# Patient Record
Sex: Female | Born: 1980 | Race: Black or African American | Hispanic: No | Marital: Married | Smoking: Never smoker
Health system: Southern US, Community
[De-identification: ages and names within clinical notes are randomized; demographics above are authoritative.]

## PROBLEM LIST (undated history)

## (undated) ENCOUNTER — Inpatient Hospital Stay (HOSPITAL_COMMUNITY): Payer: Self-pay

## (undated) DIAGNOSIS — K219 Gastro-esophageal reflux disease without esophagitis: Secondary | ICD-10-CM

## (undated) DIAGNOSIS — R609 Edema, unspecified: Secondary | ICD-10-CM

## (undated) DIAGNOSIS — Z6791 Unspecified blood type, Rh negative: Secondary | ICD-10-CM

## (undated) DIAGNOSIS — O24419 Gestational diabetes mellitus in pregnancy, unspecified control: Secondary | ICD-10-CM

## (undated) DIAGNOSIS — B999 Unspecified infectious disease: Secondary | ICD-10-CM

## (undated) DIAGNOSIS — K8020 Calculus of gallbladder without cholecystitis without obstruction: Secondary | ICD-10-CM

## (undated) DIAGNOSIS — D573 Sickle-cell trait: Secondary | ICD-10-CM

## (undated) DIAGNOSIS — Z3689 Encounter for other specified antenatal screening: Secondary | ICD-10-CM

## (undated) DIAGNOSIS — K047 Periapical abscess without sinus: Secondary | ICD-10-CM

## (undated) HISTORY — DX: Unspecified blood type, rh negative: Z67.91

## (undated) HISTORY — DX: Gestational diabetes mellitus in pregnancy, unspecified control: O24.419

## (undated) HISTORY — PX: WISDOM TOOTH EXTRACTION: SHX21

## (undated) HISTORY — PX: CHOLECYSTECTOMY: SHX55

## (undated) HISTORY — DX: Sickle-cell trait: D57.3

## (undated) HISTORY — DX: Edema, unspecified: R60.9

## (undated) HISTORY — DX: Unspecified infectious disease: B99.9

---
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
## 1998-10-31 ENCOUNTER — Inpatient Hospital Stay (HOSPITAL_COMMUNITY): Admission: AD | Admit: 1998-10-31 | Discharge: 1998-10-31 | Payer: Self-pay | Admitting: Obstetrics & Gynecology

## 1998-11-10 ENCOUNTER — Inpatient Hospital Stay (HOSPITAL_COMMUNITY): Admission: AD | Admit: 1998-11-10 | Discharge: 1998-11-10 | Payer: Self-pay | Admitting: *Deleted

## 1998-11-10 ENCOUNTER — Encounter: Payer: Self-pay | Admitting: Obstetrics & Gynecology

## 1999-02-01 ENCOUNTER — Ambulatory Visit (HOSPITAL_COMMUNITY): Admission: RE | Admit: 1999-02-01 | Discharge: 1999-02-01 | Payer: Self-pay | Admitting: *Deleted

## 1999-04-15 ENCOUNTER — Inpatient Hospital Stay (HOSPITAL_COMMUNITY): Admission: AD | Admit: 1999-04-15 | Discharge: 1999-04-15 | Payer: Self-pay | Admitting: *Deleted

## 1999-05-01 DIAGNOSIS — R609 Edema, unspecified: Secondary | ICD-10-CM

## 1999-05-01 HISTORY — DX: Edema, unspecified: R60.9

## 1999-05-19 ENCOUNTER — Inpatient Hospital Stay (HOSPITAL_COMMUNITY): Admission: AD | Admit: 1999-05-19 | Discharge: 1999-05-19 | Payer: Self-pay | Admitting: *Deleted

## 1999-06-22 ENCOUNTER — Inpatient Hospital Stay (HOSPITAL_COMMUNITY): Admission: AD | Admit: 1999-06-22 | Discharge: 1999-06-25 | Payer: Self-pay | Admitting: *Deleted

## 1999-08-30 ENCOUNTER — Encounter: Payer: Self-pay | Admitting: Emergency Medicine

## 1999-08-30 ENCOUNTER — Emergency Department (HOSPITAL_COMMUNITY): Admission: EM | Admit: 1999-08-30 | Discharge: 1999-08-30 | Payer: Self-pay | Admitting: Emergency Medicine

## 1999-11-11 ENCOUNTER — Emergency Department (HOSPITAL_COMMUNITY): Admission: EM | Admit: 1999-11-11 | Discharge: 1999-11-11 | Payer: Self-pay | Admitting: Emergency Medicine

## 2000-03-03 ENCOUNTER — Emergency Department (HOSPITAL_COMMUNITY): Admission: EM | Admit: 2000-03-03 | Discharge: 2000-03-03 | Payer: Self-pay | Admitting: Emergency Medicine

## 2001-02-04 ENCOUNTER — Encounter: Admission: RE | Admit: 2001-02-04 | Discharge: 2001-02-04 | Payer: Self-pay | Admitting: Family Medicine

## 2001-02-04 ENCOUNTER — Other Ambulatory Visit: Admission: RE | Admit: 2001-02-04 | Discharge: 2001-02-04 | Payer: Self-pay | Admitting: Family Medicine

## 2001-02-10 ENCOUNTER — Inpatient Hospital Stay (HOSPITAL_COMMUNITY): Admission: AD | Admit: 2001-02-10 | Discharge: 2001-02-10 | Payer: Self-pay | Admitting: Obstetrics

## 2001-02-13 ENCOUNTER — Ambulatory Visit (HOSPITAL_COMMUNITY): Admission: AD | Admit: 2001-02-13 | Discharge: 2001-02-13 | Payer: Self-pay | Admitting: Obstetrics & Gynecology

## 2001-02-14 ENCOUNTER — Inpatient Hospital Stay (HOSPITAL_COMMUNITY): Admission: AD | Admit: 2001-02-14 | Discharge: 2001-02-14 | Payer: Self-pay

## 2001-02-20 ENCOUNTER — Encounter: Admission: RE | Admit: 2001-02-20 | Discharge: 2001-02-20 | Payer: Self-pay | Admitting: Family Medicine

## 2001-03-21 ENCOUNTER — Encounter: Admission: RE | Admit: 2001-03-21 | Discharge: 2001-03-21 | Payer: Self-pay | Admitting: Family Medicine

## 2001-04-30 DIAGNOSIS — O24419 Gestational diabetes mellitus in pregnancy, unspecified control: Secondary | ICD-10-CM

## 2001-04-30 HISTORY — DX: Gestational diabetes mellitus in pregnancy, unspecified control: O24.419

## 2001-07-15 ENCOUNTER — Encounter: Admission: RE | Admit: 2001-07-15 | Discharge: 2001-07-15 | Payer: Self-pay | Admitting: Sports Medicine

## 2001-07-27 ENCOUNTER — Inpatient Hospital Stay (HOSPITAL_COMMUNITY): Admission: AD | Admit: 2001-07-27 | Discharge: 2001-07-27 | Payer: Self-pay | Admitting: Obstetrics and Gynecology

## 2001-07-28 ENCOUNTER — Encounter: Admission: RE | Admit: 2001-07-28 | Discharge: 2001-07-28 | Payer: Self-pay | Admitting: Family Medicine

## 2001-08-06 ENCOUNTER — Encounter: Admission: RE | Admit: 2001-08-06 | Discharge: 2001-08-06 | Payer: Self-pay | Admitting: Family Medicine

## 2001-08-06 ENCOUNTER — Other Ambulatory Visit: Admission: RE | Admit: 2001-08-06 | Discharge: 2001-08-06 | Payer: Self-pay | Admitting: Family Medicine

## 2001-08-16 ENCOUNTER — Inpatient Hospital Stay (HOSPITAL_COMMUNITY): Admission: AD | Admit: 2001-08-16 | Discharge: 2001-08-16 | Payer: Self-pay | Admitting: *Deleted

## 2001-09-09 ENCOUNTER — Encounter: Admission: RE | Admit: 2001-09-09 | Discharge: 2001-09-09 | Payer: Self-pay | Admitting: Sports Medicine

## 2001-09-10 ENCOUNTER — Ambulatory Visit (HOSPITAL_COMMUNITY): Admission: RE | Admit: 2001-09-10 | Discharge: 2001-09-10 | Payer: Self-pay | Admitting: Internal Medicine

## 2001-09-23 ENCOUNTER — Encounter: Admission: RE | Admit: 2001-09-23 | Discharge: 2001-09-23 | Payer: Self-pay | Admitting: Sports Medicine

## 2001-10-10 ENCOUNTER — Encounter: Admission: RE | Admit: 2001-10-10 | Discharge: 2001-10-10 | Payer: Self-pay | Admitting: Family Medicine

## 2001-10-22 ENCOUNTER — Encounter: Admission: RE | Admit: 2001-10-22 | Discharge: 2001-10-22 | Payer: Self-pay | Admitting: Family Medicine

## 2001-11-03 ENCOUNTER — Ambulatory Visit (HOSPITAL_COMMUNITY): Admission: RE | Admit: 2001-11-03 | Discharge: 2001-11-03 | Payer: Self-pay | Admitting: Family Medicine

## 2001-11-05 ENCOUNTER — Encounter: Admission: RE | Admit: 2001-11-05 | Discharge: 2001-11-05 | Payer: Self-pay | Admitting: Sports Medicine

## 2001-12-05 ENCOUNTER — Encounter: Admission: RE | Admit: 2001-12-05 | Discharge: 2001-12-05 | Payer: Self-pay | Admitting: Family Medicine

## 2001-12-19 ENCOUNTER — Encounter: Admission: RE | Admit: 2001-12-19 | Discharge: 2001-12-19 | Payer: Self-pay | Admitting: Family Medicine

## 2001-12-23 ENCOUNTER — Encounter: Admission: RE | Admit: 2001-12-23 | Discharge: 2001-12-23 | Payer: Self-pay | Admitting: Family Medicine

## 2001-12-30 ENCOUNTER — Encounter: Admission: RE | Admit: 2001-12-30 | Discharge: 2001-12-30 | Payer: Self-pay | Admitting: Family Medicine

## 2002-01-09 ENCOUNTER — Encounter: Admission: RE | Admit: 2002-01-09 | Discharge: 2002-01-09 | Payer: Self-pay | Admitting: Family Medicine

## 2002-01-16 ENCOUNTER — Encounter: Admission: RE | Admit: 2002-01-16 | Discharge: 2002-01-16 | Payer: Self-pay | Admitting: Family Medicine

## 2002-01-23 ENCOUNTER — Ambulatory Visit (HOSPITAL_COMMUNITY): Admission: RE | Admit: 2002-01-23 | Discharge: 2002-01-23 | Payer: Self-pay | Admitting: Family Medicine

## 2002-01-23 ENCOUNTER — Encounter: Payer: Self-pay | Admitting: Family Medicine

## 2002-01-29 ENCOUNTER — Encounter: Admission: RE | Admit: 2002-01-29 | Discharge: 2002-01-29 | Payer: Self-pay | Admitting: Family Medicine

## 2002-02-13 ENCOUNTER — Encounter: Admission: RE | Admit: 2002-02-13 | Discharge: 2002-02-13 | Payer: Self-pay | Admitting: Family Medicine

## 2002-02-20 ENCOUNTER — Encounter: Admission: RE | Admit: 2002-02-20 | Discharge: 2002-02-25 | Payer: Self-pay | Admitting: Family Medicine

## 2002-02-24 ENCOUNTER — Encounter: Admission: RE | Admit: 2002-02-24 | Discharge: 2002-02-24 | Payer: Self-pay | Admitting: Family Medicine

## 2002-03-02 ENCOUNTER — Ambulatory Visit (HOSPITAL_COMMUNITY): Admission: RE | Admit: 2002-03-02 | Discharge: 2002-03-02 | Payer: Self-pay | Admitting: Family Medicine

## 2002-03-04 ENCOUNTER — Encounter: Admission: RE | Admit: 2002-03-04 | Discharge: 2002-03-04 | Payer: Self-pay | Admitting: *Deleted

## 2002-03-04 ENCOUNTER — Encounter (HOSPITAL_COMMUNITY): Admission: AD | Admit: 2002-03-04 | Discharge: 2002-03-18 | Payer: Self-pay | Admitting: *Deleted

## 2002-03-11 ENCOUNTER — Encounter: Admission: RE | Admit: 2002-03-11 | Discharge: 2002-03-11 | Payer: Self-pay | Admitting: *Deleted

## 2002-03-18 ENCOUNTER — Encounter: Admission: RE | Admit: 2002-03-18 | Discharge: 2002-03-18 | Payer: Self-pay | Admitting: *Deleted

## 2002-03-20 ENCOUNTER — Inpatient Hospital Stay (HOSPITAL_COMMUNITY): Admission: AD | Admit: 2002-03-20 | Discharge: 2002-03-22 | Payer: Self-pay | Admitting: *Deleted

## 2002-05-13 ENCOUNTER — Encounter: Admission: RE | Admit: 2002-05-13 | Discharge: 2002-05-13 | Payer: Self-pay | Admitting: Family Medicine

## 2002-05-15 ENCOUNTER — Encounter: Admission: RE | Admit: 2002-05-15 | Discharge: 2002-05-15 | Payer: Self-pay | Admitting: Sports Medicine

## 2002-05-15 ENCOUNTER — Encounter: Payer: Self-pay | Admitting: Sports Medicine

## 2002-05-22 ENCOUNTER — Encounter: Admission: RE | Admit: 2002-05-22 | Discharge: 2002-05-22 | Payer: Self-pay | Admitting: Family Medicine

## 2002-07-30 ENCOUNTER — Encounter (INDEPENDENT_AMBULATORY_CARE_PROVIDER_SITE_OTHER): Payer: Self-pay | Admitting: *Deleted

## 2002-07-30 LAB — CONVERTED CEMR LAB

## 2002-08-18 ENCOUNTER — Other Ambulatory Visit: Admission: RE | Admit: 2002-08-18 | Discharge: 2002-08-18 | Payer: Self-pay | Admitting: Family Medicine

## 2002-08-18 ENCOUNTER — Encounter: Admission: RE | Admit: 2002-08-18 | Discharge: 2002-08-18 | Payer: Self-pay | Admitting: Sports Medicine

## 2002-12-02 ENCOUNTER — Encounter: Admission: RE | Admit: 2002-12-02 | Discharge: 2002-12-02 | Payer: Self-pay | Admitting: Family Medicine

## 2003-06-30 ENCOUNTER — Encounter: Admission: RE | Admit: 2003-06-30 | Discharge: 2003-06-30 | Payer: Self-pay | Admitting: Family Medicine

## 2003-07-14 ENCOUNTER — Encounter: Admission: RE | Admit: 2003-07-14 | Discharge: 2003-07-14 | Payer: Self-pay | Admitting: Family Medicine

## 2003-07-14 ENCOUNTER — Ambulatory Visit (HOSPITAL_COMMUNITY): Admission: RE | Admit: 2003-07-14 | Discharge: 2003-07-14 | Payer: Self-pay | Admitting: Family Medicine

## 2003-07-22 ENCOUNTER — Encounter: Admission: RE | Admit: 2003-07-22 | Discharge: 2003-07-22 | Payer: Self-pay | Admitting: Sports Medicine

## 2003-08-27 ENCOUNTER — Encounter: Admission: RE | Admit: 2003-08-27 | Discharge: 2003-08-27 | Payer: Self-pay | Admitting: Sports Medicine

## 2003-10-27 ENCOUNTER — Encounter: Admission: RE | Admit: 2003-10-27 | Discharge: 2003-10-27 | Payer: Self-pay | Admitting: Family Medicine

## 2003-11-08 ENCOUNTER — Encounter: Admission: RE | Admit: 2003-11-08 | Discharge: 2003-11-08 | Payer: Self-pay | Admitting: Sports Medicine

## 2003-11-08 ENCOUNTER — Other Ambulatory Visit: Admission: RE | Admit: 2003-11-08 | Discharge: 2003-11-08 | Payer: Self-pay | Admitting: Family Medicine

## 2003-11-22 ENCOUNTER — Emergency Department (HOSPITAL_COMMUNITY): Admission: EM | Admit: 2003-11-22 | Discharge: 2003-11-22 | Payer: Self-pay | Admitting: Emergency Medicine

## 2003-11-29 ENCOUNTER — Encounter: Admission: RE | Admit: 2003-11-29 | Discharge: 2003-11-29 | Payer: Self-pay | Admitting: Sports Medicine

## 2003-12-06 ENCOUNTER — Encounter: Admission: RE | Admit: 2003-12-06 | Discharge: 2003-12-06 | Payer: Self-pay | Admitting: Family Medicine

## 2004-06-15 ENCOUNTER — Emergency Department (HOSPITAL_COMMUNITY): Admission: EM | Admit: 2004-06-15 | Discharge: 2004-06-15 | Payer: Self-pay | Admitting: Family Medicine

## 2005-02-20 ENCOUNTER — Ambulatory Visit (HOSPITAL_COMMUNITY): Admission: RE | Admit: 2005-02-20 | Discharge: 2005-02-20 | Payer: Self-pay | Admitting: Obstetrics

## 2005-05-08 ENCOUNTER — Ambulatory Visit (HOSPITAL_COMMUNITY): Admission: RE | Admit: 2005-05-08 | Discharge: 2005-05-08 | Payer: Self-pay | Admitting: Obstetrics & Gynecology

## 2005-06-12 ENCOUNTER — Ambulatory Visit (HOSPITAL_COMMUNITY): Admission: RE | Admit: 2005-06-12 | Discharge: 2005-06-12 | Payer: Self-pay | Admitting: Obstetrics & Gynecology

## 2005-09-13 ENCOUNTER — Inpatient Hospital Stay (HOSPITAL_COMMUNITY): Admission: AD | Admit: 2005-09-13 | Discharge: 2005-09-15 | Payer: Self-pay | Admitting: Obstetrics & Gynecology

## 2005-09-16 ENCOUNTER — Encounter: Admission: RE | Admit: 2005-09-16 | Discharge: 2005-10-16 | Payer: Self-pay | Admitting: Obstetrics & Gynecology

## 2006-02-28 IMAGING — US US OB COMP +14 WK
1 series · 13 of 28 positions shown · non-contrast
Comparison: none

CLINICAL DATA: Anatomy scan; assigned gestational age is 20 weeks 4 days.

[Series 1: us ob comp +14 wk · 0.33mm/px · 13 of 62 slices shown]
[im 3/62]
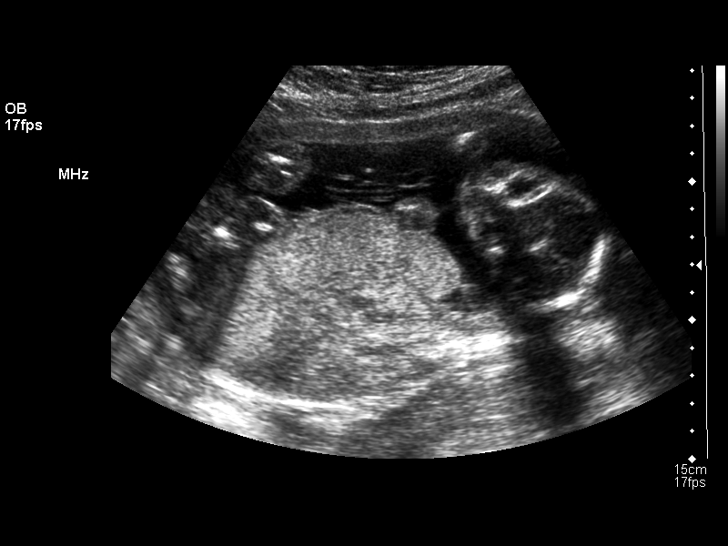
[im 7/62]
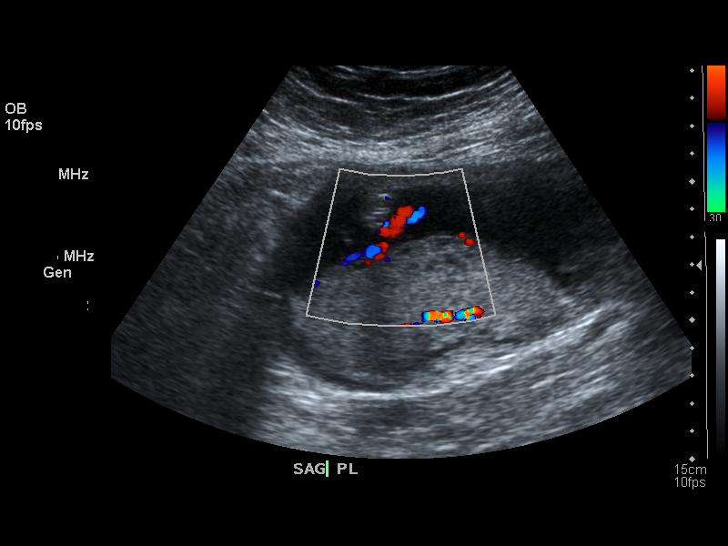
[im 12/62]
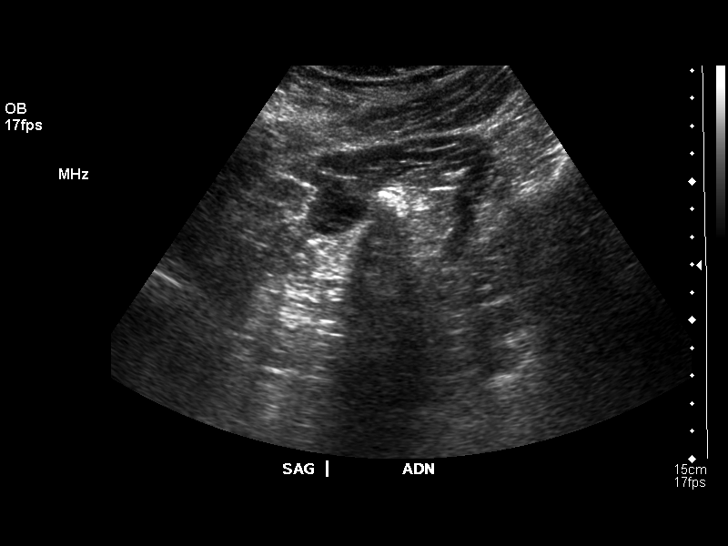
[im 16/62]
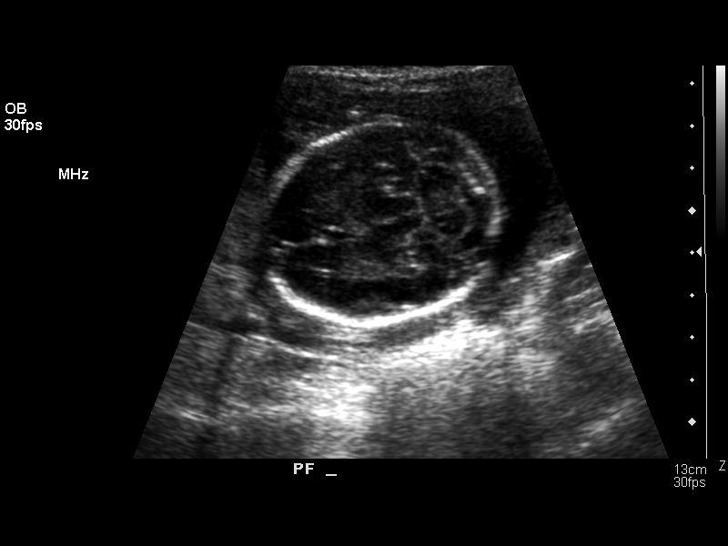
[im 21/62]
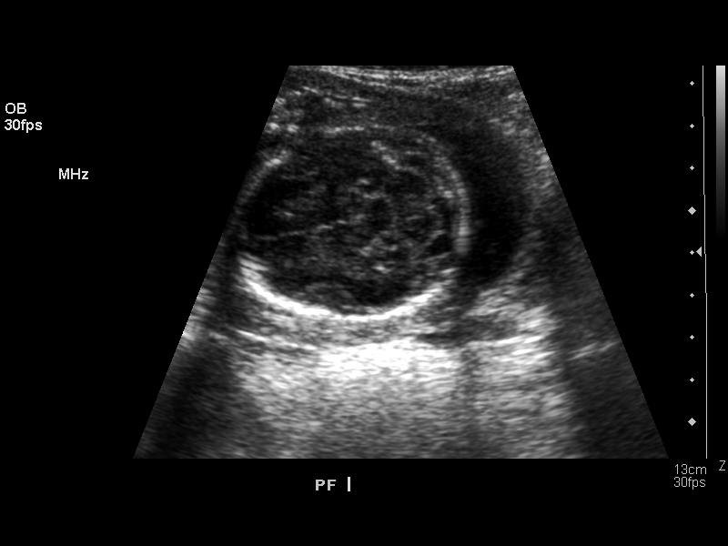
[im 25/62]
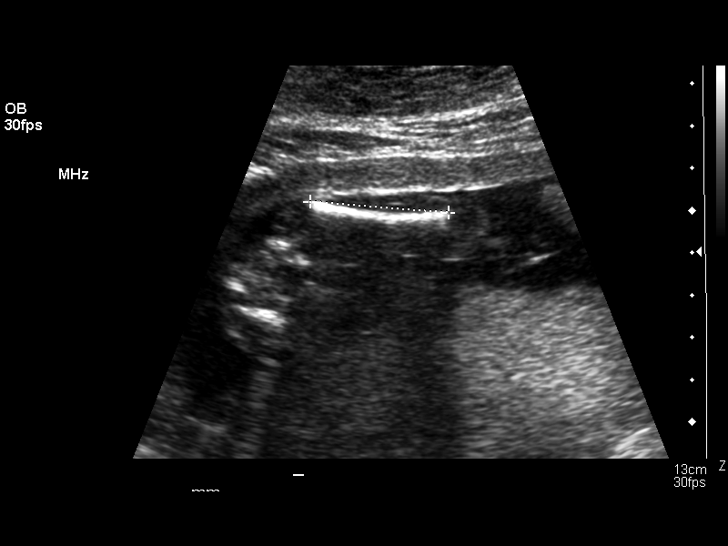
[im 32/62]
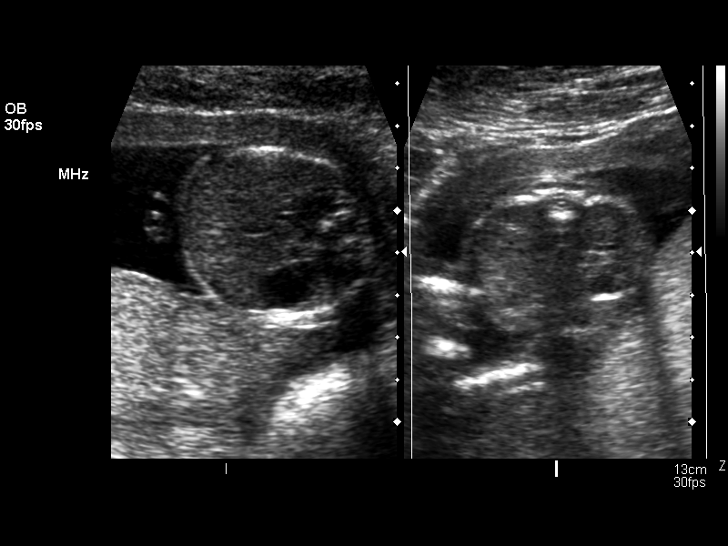
[im 37/62]
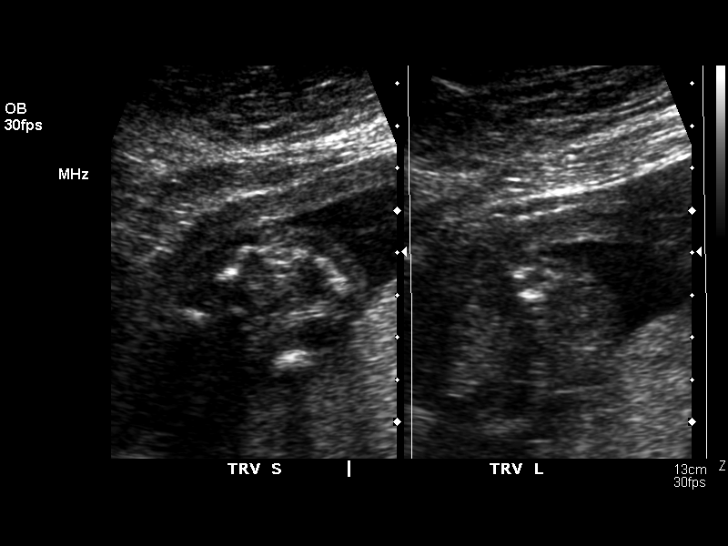
[im 41/62]
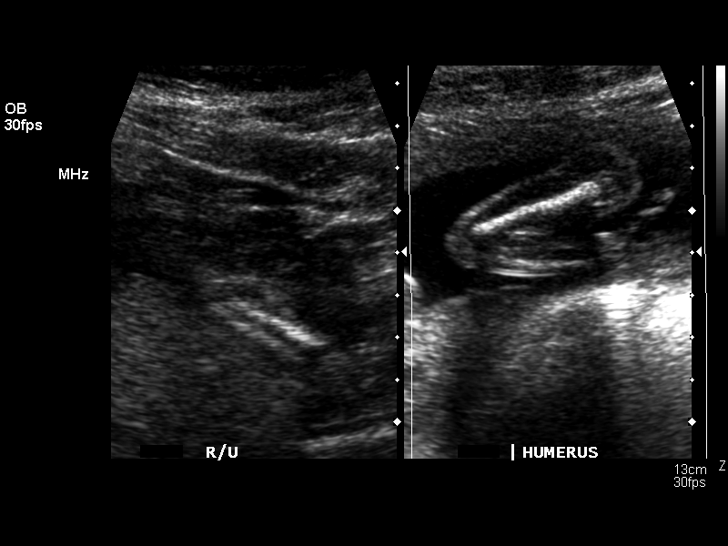
[im 46/62]
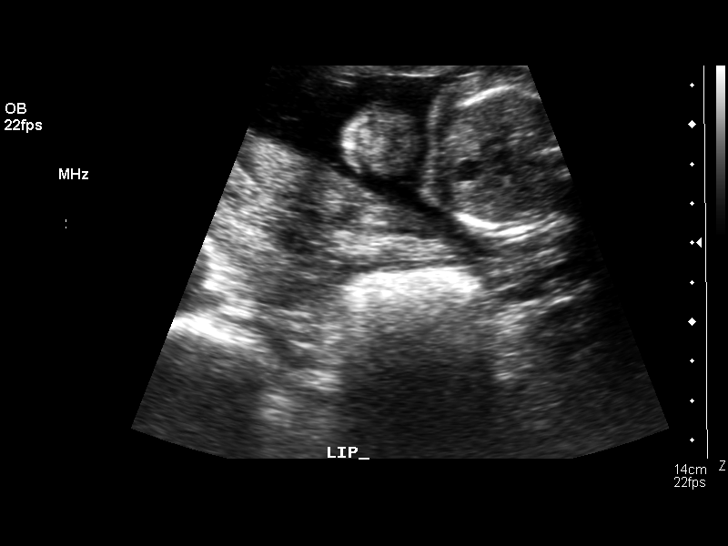
[im 50/62]
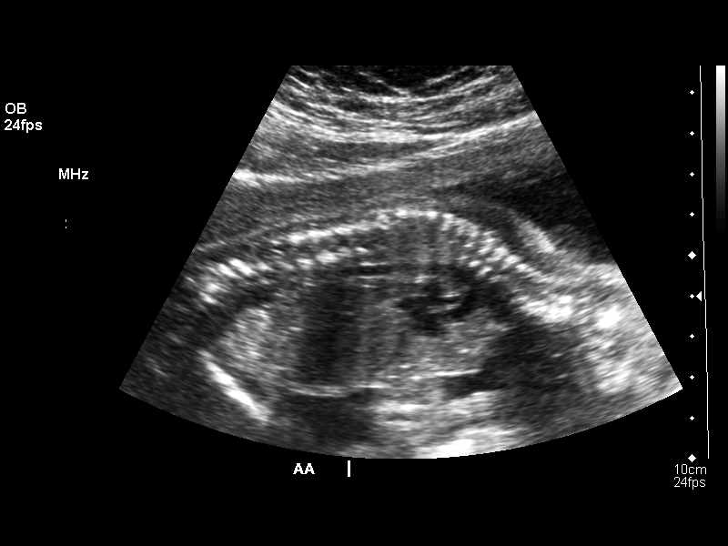
[im 55/62]
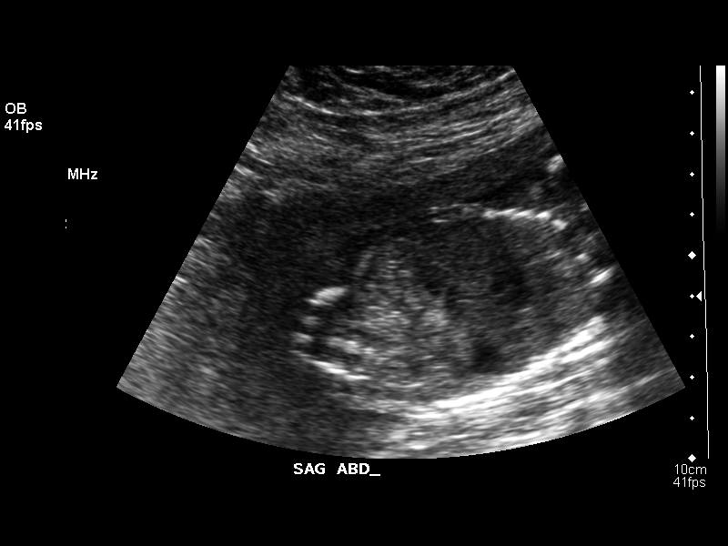
[im 59/62]
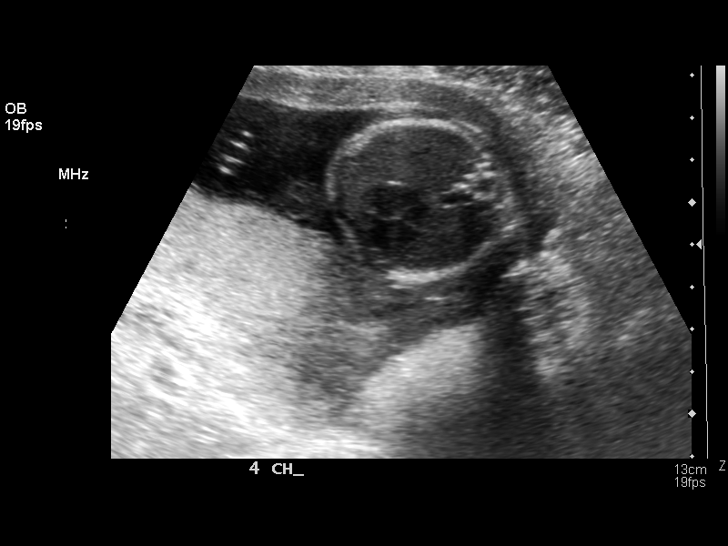

[13 of 28 positions shown; findings below may reference images not displayed]

OBSTETRICAL ULTRASOUND:
Number of Fetuses:  1
Heart Rate:  153
Movement:  Yes
Breathing:  No  
Presentation:  Cephalic
Placental Location:  Posterior
Grade:  0
Previa: No
Amniotic Fluid (Subjective):  Normal
Amniotic Fluid (Objective):   4.3 cm Vertical pocket 

FETAL BIOMETRY
BPD:  4.6 cm   20 w 0 d
HC:  17.2 cm   19 w 6 d
AC:  15.4 cm  20 w 4 d
FL:    3.3 cm  20 w 2 d

MEAN GA:  20 w 1 d  US EDC:  09/24/05

FETAL ANATOMY
Lateral Ventricles:    Visualized 
Thalami/CSP:      Visualized 
Posterior Fossa:  Visualized 
Nuchal Region:    Visualized 
Spine:      Visualized 
4 Chamber Heart on Left:      Visualized 
Stomach on Left:      Visualized 
3 Vessel Cord:    Visualized 
Cord Insertion site:    Not visualized 
Kidneys:  Visualized 
Bladder:  Visualized 
Extremities:      Visualized 

ADDITIONAL ANATOMY VISUALIZED:  LVOT, upper lip, orbits, diaphragm, heel, 5th digit, and aortic arch.

Evaluation limited by:  Fetal position.

MATERNAL UTERINE AND ADNEXAL FINDINGS
Cervix:   3.6 cm Transabdominally.  Normal ovaries.
IMPRESSION: 1.  There is a single living intrauterine gestation in cephalic presentation.  The mean gestational age by today's ultrasound is 20 weeks 1 day which is concordant with the estimated gestational age.  
2.  The visualized anatomy is normal, but the anatomy scan is slightly limited due to the fetal position.  Profile, cord insertion site, and RVOT could not be seen on today?s study.

## 2006-06-27 DIAGNOSIS — G5600 Carpal tunnel syndrome, unspecified upper limb: Secondary | ICD-10-CM | POA: Insufficient documentation

## 2006-06-27 DIAGNOSIS — J309 Allergic rhinitis, unspecified: Secondary | ICD-10-CM | POA: Insufficient documentation

## 2006-06-27 DIAGNOSIS — E669 Obesity, unspecified: Secondary | ICD-10-CM | POA: Insufficient documentation

## 2006-06-28 ENCOUNTER — Encounter (INDEPENDENT_AMBULATORY_CARE_PROVIDER_SITE_OTHER): Payer: Self-pay | Admitting: *Deleted

## 2011-11-11 ENCOUNTER — Emergency Department (HOSPITAL_COMMUNITY): Payer: PRIVATE HEALTH INSURANCE

## 2011-11-11 ENCOUNTER — Encounter (HOSPITAL_COMMUNITY): Payer: Self-pay | Admitting: *Deleted

## 2011-11-11 ENCOUNTER — Emergency Department (HOSPITAL_COMMUNITY)
Admission: EM | Admit: 2011-11-11 | Discharge: 2011-11-11 | Disposition: A | Discharge status: Home / Self Care | Payer: PRIVATE HEALTH INSURANCE | Attending: Emergency Medicine | Admitting: Emergency Medicine

## 2011-11-11 DIAGNOSIS — K8020 Calculus of gallbladder without cholecystitis without obstruction: Secondary | ICD-10-CM | POA: Insufficient documentation

## 2011-11-11 DIAGNOSIS — R1011 Right upper quadrant pain: Secondary | ICD-10-CM | POA: Insufficient documentation

## 2011-11-11 LAB — LIPASE, BLOOD: Lipase: 19 U/L (ref 11–59)

## 2011-11-11 LAB — CBC WITH DIFFERENTIAL/PLATELET
Basophils Relative: 0 % (ref 0–1)
Eosinophils Absolute: 0.1 10*3/uL (ref 0.0–0.7)
Eosinophils Relative: 2 % (ref 0–5)
HCT: 38.2 % (ref 36.0–46.0)
Hemoglobin: 12.8 g/dL (ref 12.0–15.0)
Lymphocytes Relative: 22 % (ref 12–46)
Lymphs Abs: 1.1 10*3/uL (ref 0.7–4.0)
MCH: 26.7 pg (ref 26.0–34.0)
MCHC: 33.5 g/dL (ref 30.0–36.0)
MCV: 79.7 fL (ref 78.0–100.0)
Monocytes Relative: 7 % (ref 3–12)
Neutro Abs: 3.4 10*3/uL (ref 1.7–7.7)
Neutrophils Relative %: 68 % (ref 43–77)
Platelets: 295 10*3/uL (ref 150–400)
RBC: 4.79 MIL/uL (ref 3.87–5.11)
RDW: 15.1 % (ref 11.5–15.5)
WBC: 4.9 10*3/uL (ref 4.0–10.5)

## 2011-11-11 LAB — COMPREHENSIVE METABOLIC PANEL
ALT: 64 U/L — ABNORMAL HIGH (ref 0–35)
AST: 51 U/L — ABNORMAL HIGH (ref 0–37)
Albumin: 3.7 g/dL (ref 3.5–5.2)
Alkaline Phosphatase: 97 U/L (ref 39–117)
BUN: 13 mg/dL (ref 6–23)
CO2: 27 mEq/L (ref 19–32)
Chloride: 99 mEq/L (ref 96–112)
Creatinine, Ser: 1.05 mg/dL (ref 0.50–1.10)
Glucose, Bld: 123 mg/dL — ABNORMAL HIGH (ref 70–99)
Potassium: 5.1 mEq/L (ref 3.5–5.1)
Sodium: 134 mEq/L — ABNORMAL LOW (ref 135–145)
Total Bilirubin: 0.3 mg/dL (ref 0.3–1.2)

## 2011-11-11 LAB — URINALYSIS, ROUTINE W REFLEX MICROSCOPIC
Bilirubin Urine: NEGATIVE
Glucose, UA: NEGATIVE mg/dL
Ketones, ur: NEGATIVE mg/dL
Nitrite: NEGATIVE
Protein, ur: NEGATIVE mg/dL
Urobilinogen, UA: 0.2 mg/dL (ref 0.0–1.0)
pH: 7 (ref 5.0–8.0)

## 2011-11-11 LAB — PREGNANCY, URINE: Preg Test, Ur: NEGATIVE

## 2011-11-11 MED ORDER — ONDANSETRON HCL 4 MG/2ML IJ SOLN
INTRAMUSCULAR | Status: AC
  Filled 2011-11-11: qty 2

## 2011-11-11 MED ORDER — MORPHINE SULFATE 4 MG/ML IJ SOLN
4.0000 mg | Freq: Once | INTRAMUSCULAR | Status: AC
  Administered 2011-11-11: 4 mg via INTRAVENOUS

## 2011-11-11 MED ORDER — MORPHINE SULFATE 4 MG/ML IJ SOLN
4.0000 mg | Freq: Once | INTRAMUSCULAR | Status: AC
  Administered 2011-11-11: 4 mg via INTRAVENOUS
  Filled 2011-11-11: qty 1

## 2011-11-11 MED ORDER — ONDANSETRON HCL 4 MG PO TABS: 4.0000 mg | ORAL_TABLET | Freq: Four times a day (QID) | ORAL | Status: AC

## 2011-11-11 MED ORDER — ONDANSETRON HCL 4 MG/2ML IJ SOLN
4.0000 mg | Freq: Once | INTRAMUSCULAR | Status: AC
  Administered 2011-11-11: 4 mg via INTRAVENOUS

## 2011-11-11 MED ORDER — SODIUM CHLORIDE 0.9 % IV BOLUS (SEPSIS)
1000.0000 mL | Freq: Once | INTRAVENOUS | Status: AC
  Administered 2011-11-11: 1000 mL via INTRAVENOUS

## 2011-11-11 MED ORDER — MORPHINE SULFATE 4 MG/ML IJ SOLN
INTRAMUSCULAR | Status: AC
  Filled 2011-11-11: qty 1

## 2011-11-11 MED ORDER — OXYCODONE-ACETAMINOPHEN 5-325 MG PO TABS: 2.0000 | ORAL_TABLET | ORAL | Status: DC | PRN

## 2011-11-11 MED ORDER — ONDANSETRON HCL 4 MG/2ML IJ SOLN
4.0000 mg | Freq: Once | INTRAMUSCULAR | Status: AC
  Administered 2011-11-11: 4 mg via INTRAVENOUS
  Filled 2011-11-11: qty 2

## 2011-11-11 NOTE — ED Notes (Signed)
Ultrasound at bedside to perform exam

## 2011-11-11 NOTE — ED Provider Notes (Signed)
History     CSN: [id number]  Arrival date & time 11/11/11  0747   First MD Initiated Contact with Patient 11/11/11 [phone]      Chief Complaint  Patient presents with  . Abdominal Pain    URQ    (Consider location/radiation/quality/duration/timing/severity/associated sxs/prior treatment) HPI Comments: She presents with complaints of pain in her abdomen which she describes as a sharp cramping pain located in the right upper quadrant. She says it's been off and on for last 3 days however got worse last night when ate a hamburger. She's had some intermittent nausea but no vomiting. Denies any constipation or diarrhea. Denies he fevers. Denies UTI symptoms. Her last normal menstrual period was 5 days ago.  Patient is a 31 y.o. female presenting with abdominal pain. The history is provided by the patient.  Abdominal Pain The primary symptoms of the illness include abdominal pain and nausea. The primary symptoms of the illness do not include fever, fatigue, shortness of breath, vomiting or diarrhea.  Symptoms associated with the illness do not include chills, diaphoresis, hematuria, frequency or back pain.    No past medical history on file.  No past surgical history on file.  Family History  Problem Relation Age of Onset  . Cancer Mother   . Cancer Sister   . Cancer Brother     History  Substance Use Topics  . Smoking status: Never Smoker   . Smokeless tobacco: Not on file  . Alcohol Use: No    OB History    Grav Para Term Preterm Abortions TAB SAB Ect Mult Living                  Review of Systems  Constitutional: Negative for fever, chills, diaphoresis and fatigue.  HENT: Negative for congestion, rhinorrhea and sneezing.   Eyes: Negative.   Respiratory: Negative for cough, chest tightness and shortness of breath.   Cardiovascular: Negative for chest pain and leg swelling.  Gastrointestinal: Positive for nausea and abdominal pain. Negative for vomiting, diarrhea and  blood in stool.  Genitourinary: Negative for frequency, hematuria, flank pain and difficulty urinating.  Musculoskeletal: Negative for back pain and arthralgias.  Skin: Negative for rash.  Neurological: Negative for dizziness, speech difficulty, weakness, numbness and headaches.    Allergies  Review of patient's allergies indicates no known allergies.  Home Medications   Current Outpatient Rx  Name Route Sig Dispense Refill  . CALCIUM CARBONATE ANTACID 500 MG PO CHEW Oral Chew 2 tablets by mouth daily.    Marland Kitchen ONDANSETRON HCL 4 MG PO TABS Oral Take 1 tablet (4 mg total) by mouth every 6 (six) hours. 12 tablet 0  . OXYCODONE-ACETAMINOPHEN 5-325 MG PO TABS Oral Take 2 tablets by mouth every 4 (four) hours as needed for pain. 15 tablet 0    BP 121/81  Pulse 64  Temp 98.1 F (36.7 C) (Oral)  Resp 17  SpO2 99%  LMP 11/06/2011  Physical Exam  Constitutional: She is oriented to person, place, and time. She appears well-developed and well-nourished.  HENT:  Head: Normocephalic and atraumatic.  Eyes: Pupils are equal, round, and reactive to light.  Neck: Normal range of motion. Neck supple.  Cardiovascular: Normal rate, regular rhythm and normal heart sounds.   Pulmonary/Chest: Effort normal and breath sounds normal. No respiratory distress. She has no wheezes. She has no rales. She exhibits no tenderness.  Abdominal: Soft. Bowel sounds are normal. There is tenderness (Moderate tenderness to the right upper quadrant.).  There is no rebound and no guarding.  Musculoskeletal: Normal range of motion. She exhibits no edema.  Lymphadenopathy:    She has no cervical adenopathy.  Neurological: She is alert and oriented to person, place, and time.  Skin: Skin is warm and dry. No rash noted.  Psychiatric: She has a normal mood and affect.    ED Course  Procedures (including critical care time)  Results for orders placed during the hospital encounter of 11/11/11  CBC WITH DIFFERENTIAL       Component Value Range   WBC 4.9  4.0 - 10.5 K/uL   RBC 4.79  3.87 - 5.11 MIL/uL   Hemoglobin 12.8  12.0 - 15.0 g/dL   HCT 16.1  09.6 - 04.5 %   MCV 79.7  78.0 - 100.0 fL   MCH 26.7  26.0 - 34.0 pg   MCHC 33.5  30.0 - 36.0 g/dL   RDW 40.9  81.1 - 91.4 %   Platelets 295  150 - 400 K/uL   Neutrophils Relative 68  43 - 77 %   Neutro Abs 3.4  1.7 - 7.7 K/uL   Lymphocytes Relative 22  12 - 46 %   Lymphs Abs 1.1  0.7 - 4.0 K/uL   Monocytes Relative 7  3 - 12 %   Monocytes Absolute 0.3  0.1 - 1.0 K/uL   Eosinophils Relative 2  0 - 5 %   Eosinophils Absolute 0.1  0.0 - 0.7 K/uL   Basophils Relative 0  0 - 1 %   Basophils Absolute 0.0  0.0 - 0.1 K/uL  COMPREHENSIVE METABOLIC PANEL      Component Value Range   Sodium 134 (*) 135 - 145 mEq/L   Potassium 5.1  3.5 - 5.1 mEq/L   Chloride 99  96 - 112 mEq/L   CO2 27  19 - 32 mEq/L   Glucose, Bld 123 (*) 70 - 99 mg/dL   BUN 13  6 - 23 mg/dL   Creatinine, Ser 7.82  0.50 - 1.10 mg/dL   Calcium 95.6  8.4 - 21.3 mg/dL   Total Protein 7.8  6.0 - 8.3 g/dL   Albumin 3.7  3.5 - 5.2 g/dL   AST 51 (*) 0 - 37 U/L   ALT 64 (*) 0 - 35 U/L   Alkaline Phosphatase 97  39 - 117 U/L   Total Bilirubin 0.3  0.3 - 1.2 mg/dL   GFR calc non Af Amer 69 (*) >90 mL/min   GFR calc Af Amer 80 (*) >90 mL/min  LIPASE, BLOOD      Component Value Range   Lipase 19  11 - 59 U/L  URINALYSIS, ROUTINE W REFLEX MICROSCOPIC      Component Value Range   Color, Urine YELLOW  YELLOW   APPearance CLEAR  CLEAR   Specific Gravity, Urine 1.018  1.005 - 1.030   pH 7.0  5.0 - 8.0   Glucose, UA NEGATIVE  NEGATIVE mg/dL   Hgb urine dipstick NEGATIVE  NEGATIVE   Bilirubin Urine NEGATIVE  NEGATIVE   Ketones, ur NEGATIVE  NEGATIVE mg/dL   Protein, ur NEGATIVE  NEGATIVE mg/dL   Urobilinogen, UA 0.2  0.0 - 1.0 mg/dL   Nitrite NEGATIVE  NEGATIVE   Leukocytes, UA NEGATIVE  NEGATIVE  PREGNANCY, URINE      Component Value Range   Preg Test, Ur NEGATIVE  NEGATIVE   US Abdomen  Complete  11/11/2011  *RADIOLOGY REPORT*  Clinical Data:  Right  upper quadrant abdominal pain.  COMPLETE ABDOMINAL ULTRASOUND  Comparison:  None.  Findings:  Gallbladder:  Numerous gallstones.  No wall thickening or pericholecystic fluid. Sonographic Murphy's sign was not elicited.  Common bile duct: Normal, 4 mm.  Liver: Normal in echogenicity, without focal lesion.  IVC: Negative  Pancreas:  Poorly visualized due to overlying bowel gas.  Spleen:  Normal in size and echogenicity.  Right Kidney:  10.4 cm. No hydronephrosis.  Left Kidney:  11.0 cm. No hydronephrosis.  Abdominal aorta:  Nonaneurysmal without ascites.  IMPRESSION: Cholelithiasis without cholecystitis or biliary ductal dilatation.  Portions of anatomy obscured by overlying bowel gas.  Original Report Authenticated By: Consuello Bossier, M.D.      1. Cholelithiasis       MDM  Pt with gallstones, mildly elevated LFTs.  Pain controlled here.  No fever or other signs of cholecystitis.  Will d/c with rx for pain, nausea meds.  F/u with surgery.       Rolan Bucco, MD 11/11/11 1140

## 2011-11-11 NOTE — ED Notes (Signed)
Pt states she has had RUQ pain x3 days, worsening since last night.  Pt states she has noticed that eating a particularly fatty meal (ribs and steak yesterday, hamburger the day before).  Pt endorses nausea, but denies vomiting and diarrhea.  Pt has bowel sounds in all 4 quadrants and pain with palpation to RUQ.  Pt's lung sounds clear in all fields with peripheral pulses in all 4 extremities. Skin is warm and dry and patient is ambulatory and A&O x4.  Pt's husband is at bedside.

## 2011-11-11 NOTE — ED Notes (Signed)
Patient is alert and oriented x3.  She is complaining of URQ abdominal pain that started 3 days ago. She denies any past history of this issue.  Currently she rates her pain at 9 of 10 with some nausea.

## 2011-11-13 ENCOUNTER — Encounter (INDEPENDENT_AMBULATORY_CARE_PROVIDER_SITE_OTHER): Payer: Self-pay | Admitting: Surgery

## 2011-11-13 ENCOUNTER — Ambulatory Visit (INDEPENDENT_AMBULATORY_CARE_PROVIDER_SITE_OTHER): Payer: PRIVATE HEALTH INSURANCE | Admitting: Surgery

## 2011-11-13 VITALS — BP 120/80 | HR 72 | Temp 97.8°F | Resp 12 | Ht 65.0 in | Wt 243.8 lb

## 2011-11-13 DIAGNOSIS — K8010 Calculus of gallbladder with chronic cholecystitis without obstruction: Secondary | ICD-10-CM | POA: Insufficient documentation

## 2011-11-13 NOTE — Patient Instructions (Signed)
Miralax (polyethylene glycol) for constipation

## 2011-11-13 NOTE — Progress Notes (Signed)
Patient ID: Carmen Kelly, female   DOB: 03/31/1981, 31 y.o.   MRN: [id number]  Chief Complaint  Patient presents with  . Abdominal Pain    Gallbladder    HPI Carmen Kelly is a 31 y.o. female.  Referred by Dr. Belfi - [place] HPI 31 yo female presents with several days of RUQ/ epigastric abdominal pain, bloating, nausea, vomiting that began after eating some greasy food over the weekend.  She has had bloating and shoulder pain for several months, but never this severe.  She was evaluated in the ED and was noted to have cholelithiasis with no sign of cholecystitis.  No past medical history on file.  No past surgical history on file.  Family History  Problem Relation Age of Onset  . Cancer Mother   . Cancer Sister   . Cancer Brother     Social History History  Substance Use Topics  . Smoking status: Never Smoker   . Smokeless tobacco: Not on file  . Alcohol Use: No    No Known Allergies  Current Outpatient Prescriptions  Medication Sig Dispense Refill  . calcium carbonate (TUMS - DOSED IN MG ELEMENTAL CALCIUM) 500 MG chewable tablet Chew 2 tablets by mouth daily.      . ondansetron (ZOFRAN) 4 MG tablet Take 1 tablet (4 mg total) by mouth every 6 (six) hours.  12 tablet  0  . oxyCODONE-acetaminophen (PERCOCET) 5-325 MG per tablet Take 2 tablets by mouth every 4 (four) hours as needed for pain.  15 tablet  0    Review of Systems Review of Systems  Constitutional: Negative for fever, chills and unexpected weight change.  HENT: Negative for hearing loss, congestion, sore throat, trouble swallowing and voice change.   Eyes: Negative for visual disturbance.  Respiratory: Negative for cough and wheezing.   Cardiovascular: Negative.  Negative for chest pain, palpitations and leg swelling.  Gastrointestinal: Positive for nausea, vomiting, abdominal pain, constipation and abdominal distention. Negative for diarrhea, blood in stool and anal bleeding.  Genitourinary: Negative  for hematuria, vaginal bleeding and difficulty urinating.  Musculoskeletal: Negative for arthralgias.  Skin: Negative for rash and wound.  Neurological: Negative for seizures, syncope and headaches.  Hematological: Negative for adenopathy. Does not bruise/bleed easily.  Psychiatric/Behavioral: Negative for confusion.    Blood pressure 120/80, pulse 72, temperature 97.8 F (36.6 C), temperature source Temporal, resp. rate 12, height 5' 5" (1.651 m), weight 243 lb 12.8 oz (110.587 kg), last menstrual period 11/06/2011.  Physical Exam Physical Exam WDWN in NAD HEENT:  EOMI, sclera anicteric Neck:  No masses, no thyromegaly Lungs:  CTA bilaterally; normal respiratory effort CV:  Regular rate and rhythm; no murmurs Abd:  +bowel sounds, tender ruq, no palpable masses; no guarding Ext:  Well-perfused; no edema Skin:  Warm, dry; no sign of jaundice  Data Reviewed RADIOLOGY REPORT*  Clinical Data: Right upper quadrant abdominal pain.  COMPLETE ABDOMINAL ULTRASOUND  Comparison: None.  Findings:  Gallbladder: Numerous gallstones. No wall thickening or  pericholecystic fluid. Sonographic Murphy's sign was not elicited.  Common bile duct: Normal, 4 mm.  Liver: Normal in echogenicity, without focal lesion.  IVC: Negative  Pancreas: Poorly visualized due to overlying bowel gas.  Spleen: Normal in size and echogenicity.  Right Kidney: 10.4 cm. No hydronephrosis.  Left Kidney: 11.0 cm. No hydronephrosis.  Abdominal aorta: Nonaneurysmal without ascites.  IMPRESSION:  Cholelithiasis without cholecystitis or biliary ductal dilatation.  Portions of anatomy obscured by overlying bowel gas.  Original   Report Authenticated By: KYLE D. TALBOT, M.D.      Lab Results  Component Value Date   ALT 64* 11/11/2011   AST 51* 11/11/2011   ALKPHOS 97 11/11/2011   BILITOT 0.3 11/11/2011    Lab Results  Component Value Date   WBC 4.9 11/11/2011   HGB 12.8 11/11/2011   HCT 38.2 11/11/2011   MCV 79.7  11/11/2011   PLT 295 11/11/2011      Assessment    Chronic calculus cholecystitis     Plan    Laparoscopic cholecystectomy with intraoperative cholangiogram.  The surgical procedure has been discussed with the patient.  Potential risks, benefits, alternative treatments, and expected outcomes have been explained.  All of the patient's questions at this time have been answered.  The likelihood of reaching the patient's treatment goal is good.  The patient understand the proposed surgical procedure and wishes to proceed.         Lavoris Sparling K. 11/13/2011, 2:10 PM    

## 2011-11-14 ENCOUNTER — Encounter (HOSPITAL_COMMUNITY): Payer: Self-pay | Admitting: Pharmacy Technician

## 2011-11-15 NOTE — Patient Instructions (Addendum)
18 Carmen Kelly  11/15/2011   Your procedure is scheduled on:  11-19-11 at 1:15 PM  Report to SHORT STAY DEPT  at 10:45 PM AM.  Call this number if you have problems the morning of surgery: [phone]   Remember:   Do not eat food or drink liquids AFTER MIDNIGHT  May have clear liquids UNTIL 6 HOURS BEFORE SURGERY (7:15 AM)  Clear liquids include soda, tea, black coffee, apple or grape juice, broth.  Take these medicines the morning of surgery with A SIP OF WATER: PERCOCET IF NEEDED   Do not wear jewelry, make-up or nail polish.  Do not wear lotions, powders, or perfumes.   Do not shave legs or underarms 48 hrs. before surgery (men may shave face)  Do not bring valuables to the hospital.  Contacts, dentures or bridgework may not be worn into surgery.  Leave suitcase in the car. After surgery it may be brought to your room.  For patients admitted to the hospital, checkout time is 11:00 AM the day of discharge.   Patients discharged the day of surgery will not be allowed to drive home.    Special Instructions:   Please read over the following fact sheets that you were given: MRSA  Information               SHOWER WITH BETASEPT THE NIGHT BEFORE SURGERY AND THE MORNING OF SURGERY

## 2011-11-16 ENCOUNTER — Encounter (HOSPITAL_COMMUNITY): Payer: Self-pay

## 2011-11-16 ENCOUNTER — Encounter (HOSPITAL_COMMUNITY)
Admission: RE | Admit: 2011-11-16 | Discharge: 2011-11-16 | Disposition: A | Discharge status: Home / Self Care | Payer: PRIVATE HEALTH INSURANCE | Source: Ambulatory Visit | Attending: Surgery | Admitting: Surgery

## 2011-11-16 HISTORY — DX: Gastro-esophageal reflux disease without esophagitis: K21.9

## 2011-11-16 HISTORY — DX: Calculus of gallbladder without cholecystitis without obstruction: K80.20

## 2011-11-16 LAB — COMPREHENSIVE METABOLIC PANEL
ALT: 20 U/L (ref 0–35)
AST: 15 U/L (ref 0–37)
Alkaline Phosphatase: 93 U/L (ref 39–117)
BUN: 12 mg/dL (ref 6–23)
CO2: 32 mEq/L (ref 19–32)
Calcium: 9.3 mg/dL (ref 8.4–10.5)
Chloride: 95 mEq/L — ABNORMAL LOW (ref 96–112)
Creatinine, Ser: 1 mg/dL (ref 0.50–1.10)
GFR calc Af Amer: 85 mL/min — ABNORMAL LOW (ref >90)
GFR calc non Af Amer: 73 mL/min — ABNORMAL LOW (ref >90)
Glucose, Bld: 99 mg/dL (ref 70–99)
Potassium: 3.8 mEq/L (ref 3.5–5.1)
Sodium: 135 mEq/L (ref 135–145)
Total Bilirubin: 0.4 mg/dL (ref 0.3–1.2)
Total Protein: 8.1 g/dL (ref 6.0–8.3)

## 2011-11-16 LAB — SURGICAL PCR SCREEN
MRSA, PCR: NEGATIVE
Staphylococcus aureus: POSITIVE — AB

## 2011-11-19 ENCOUNTER — Encounter (HOSPITAL_COMMUNITY): Payer: Self-pay | Admitting: Anesthesiology

## 2011-11-19 ENCOUNTER — Encounter (HOSPITAL_COMMUNITY)
Admission: RE | Disposition: A | Discharge status: Home / Self Care | Payer: Self-pay | Source: Ambulatory Visit | Attending: Surgery

## 2011-11-19 ENCOUNTER — Ambulatory Visit (INDEPENDENT_AMBULATORY_CARE_PROVIDER_SITE_OTHER): Payer: PRIVATE HEALTH INSURANCE | Admitting: General Surgery

## 2011-11-19 ENCOUNTER — Encounter (HOSPITAL_COMMUNITY): Payer: Self-pay | Admitting: *Deleted

## 2011-11-19 ENCOUNTER — Inpatient Hospital Stay (HOSPITAL_COMMUNITY)
Admission: RE | Admit: 2011-11-19 | Discharge: 2011-11-21 | DRG: 419 | Disposition: A | Discharge status: Home / Self Care | Payer: PRIVATE HEALTH INSURANCE | Source: Ambulatory Visit | Attending: Surgery | Admitting: Surgery

## 2011-11-19 ENCOUNTER — Ambulatory Visit (HOSPITAL_COMMUNITY): Payer: PRIVATE HEALTH INSURANCE | Admitting: Anesthesiology

## 2011-11-19 DIAGNOSIS — K219 Gastro-esophageal reflux disease without esophagitis: Secondary | ICD-10-CM | POA: Diagnosis present

## 2011-11-19 DIAGNOSIS — K8000 Calculus of gallbladder with acute cholecystitis without obstruction: Principal | ICD-10-CM | POA: Diagnosis present

## 2011-11-19 DIAGNOSIS — K8010 Calculus of gallbladder with chronic cholecystitis without obstruction: Secondary | ICD-10-CM

## 2011-11-19 DIAGNOSIS — Z01812 Encounter for preprocedural laboratory examination: Secondary | ICD-10-CM

## 2011-11-19 DIAGNOSIS — K8066 Calculus of gallbladder and bile duct with acute and chronic cholecystitis without obstruction: Secondary | ICD-10-CM

## 2011-11-19 HISTORY — PX: CHOLECYSTECTOMY: SHX55

## 2011-11-19 LAB — HCG, SERUM, QUALITATIVE: Preg, Serum: NEGATIVE

## 2011-11-19 SURGERY — LAPAROSCOPIC CHOLECYSTECTOMY
Anesthesia: General | Site: Abdomen | Wound class: Contaminated

## 2011-11-19 MED ORDER — LACTATED RINGERS IV SOLN
INTRAVENOUS | Status: DC | PRN
  Administered 2011-11-19 (×2): via INTRAVENOUS

## 2011-11-19 MED ORDER — BUPIVACAINE-EPINEPHRINE 0.25% -1:200000 IJ SOLN
INTRAMUSCULAR | Status: DC | PRN
  Administered 2011-11-19: 20 mL

## 2011-11-19 MED ORDER — DEXTROSE 5 % IV SOLN
3.0000 g | INTRAVENOUS | Status: AC
  Administered 2011-11-19: 3 g via INTRAVENOUS
  Filled 2011-11-19: qty 3000

## 2011-11-19 MED ORDER — PIPERACILLIN-TAZOBACTAM 3.375 G IVPB
3.3750 g | Freq: Three times a day (TID) | INTRAVENOUS | Status: DC
  Administered 2011-11-19 – 2011-11-21 (×5): 3.375 g via INTRAVENOUS
  Filled 2011-11-19 (×6): qty 50

## 2011-11-19 MED ORDER — MUPIROCIN 2 % EX OINT
TOPICAL_OINTMENT | CUTANEOUS | Status: AC
  Filled 2011-11-19: qty 22

## 2011-11-19 MED ORDER — ACETAMINOPHEN 10 MG/ML IV SOLN
INTRAVENOUS | Status: AC
  Filled 2011-11-19: qty 100

## 2011-11-19 MED ORDER — ONDANSETRON HCL 4 MG/2ML IJ SOLN
4.0000 mg | Freq: Four times a day (QID) | INTRAMUSCULAR | Status: DC | PRN
  Administered 2011-11-20: 4 mg via INTRAVENOUS
  Filled 2011-11-19: qty 2

## 2011-11-19 MED ORDER — HYDROMORPHONE HCL PF 1 MG/ML IJ SOLN
0.2500 mg | INTRAMUSCULAR | Status: DC | PRN
  Administered 2011-11-19: 0.25 mg via INTRAVENOUS
  Administered 2011-11-19: 0.5 mg via INTRAVENOUS

## 2011-11-19 MED ORDER — LACTATED RINGERS IR SOLN
Status: DC | PRN
  Administered 2011-11-19: 2000 mL

## 2011-11-19 MED ORDER — ACETAMINOPHEN 10 MG/ML IV SOLN
INTRAVENOUS | Status: DC | PRN
  Administered 2011-11-19: 1000 mg via INTRAVENOUS

## 2011-11-19 MED ORDER — KCL IN DEXTROSE-NACL 20-5-0.45 MEQ/L-%-% IV SOLN
INTRAVENOUS | Status: DC
  Administered 2011-11-20: 75 mL via INTRAVENOUS
  Filled 2011-11-19 (×3): qty 1000

## 2011-11-19 MED ORDER — ROCURONIUM BROMIDE 100 MG/10ML IV SOLN
INTRAVENOUS | Status: DC | PRN
  Administered 2011-11-19: 50 mg via INTRAVENOUS
  Administered 2011-11-19: 5 mg via INTRAVENOUS

## 2011-11-19 MED ORDER — METOCLOPRAMIDE HCL 5 MG/ML IJ SOLN
INTRAMUSCULAR | Status: DC | PRN
  Administered 2011-11-19: 10 mg via INTRAVENOUS

## 2011-11-19 MED ORDER — PROMETHAZINE HCL 25 MG/ML IJ SOLN
6.2500 mg | INTRAMUSCULAR | Status: AC | PRN
  Administered 2011-11-19 (×2): 6.25 mg via INTRAVENOUS

## 2011-11-19 MED ORDER — MIDAZOLAM HCL 5 MG/5ML IJ SOLN
INTRAMUSCULAR | Status: DC | PRN
  Administered 2011-11-19: 2 mg via INTRAVENOUS

## 2011-11-19 MED ORDER — NEOSTIGMINE METHYLSULFATE 1 MG/ML IJ SOLN
INTRAMUSCULAR | Status: DC | PRN
  Administered 2011-11-19: 4 mg via INTRAVENOUS

## 2011-11-19 MED ORDER — CEFAZOLIN SODIUM-DEXTROSE 2-3 GM-% IV SOLR
INTRAVENOUS | Status: AC
  Filled 2011-11-19: qty 50

## 2011-11-19 MED ORDER — BUPIVACAINE-EPINEPHRINE PF 0.25-1:200000 % IJ SOLN
INTRAMUSCULAR | Status: AC
  Filled 2011-11-19: qty 30

## 2011-11-19 MED ORDER — DEXAMETHASONE SODIUM PHOSPHATE 10 MG/ML IJ SOLN
INTRAMUSCULAR | Status: DC | PRN
  Administered 2011-11-19: 5 mg via INTRAVENOUS

## 2011-11-19 MED ORDER — PROMETHAZINE HCL 25 MG/ML IJ SOLN
INTRAMUSCULAR | Status: AC
  Filled 2011-11-19: qty 1

## 2011-11-19 MED ORDER — LIDOCAINE HCL (CARDIAC) 20 MG/ML IV SOLN
INTRAVENOUS | Status: DC | PRN
  Administered 2011-11-19: 50 mg via INTRAVENOUS

## 2011-11-19 MED ORDER — OXYCODONE-ACETAMINOPHEN 5-325 MG PO TABS
1.0000 | ORAL_TABLET | ORAL | Status: DC | PRN
  Administered 2011-11-19 – 2011-11-21 (×6): 2 via ORAL
  Filled 2011-11-19 (×6): qty 2

## 2011-11-19 MED ORDER — ONDANSETRON HCL 4 MG PO TABS: 4.0000 mg | ORAL_TABLET | Freq: Four times a day (QID) | ORAL | Status: DC | PRN

## 2011-11-19 MED ORDER — 0.9 % SODIUM CHLORIDE (POUR BTL) OPTIME
TOPICAL | Status: DC | PRN
  Administered 2011-11-19: 1000 mL

## 2011-11-19 MED ORDER — CEFAZOLIN SODIUM 1-5 GM-% IV SOLN
INTRAVENOUS | Status: AC
  Filled 2011-11-19: qty 50

## 2011-11-19 MED ORDER — ONDANSETRON HCL 4 MG/2ML IJ SOLN
INTRAMUSCULAR | Status: DC | PRN
  Administered 2011-11-19: 4 mg via INTRAVENOUS

## 2011-11-19 MED ORDER — HYDROMORPHONE HCL PF 1 MG/ML IJ SOLN
1.0000 mg | INTRAMUSCULAR | Status: DC | PRN
  Administered 2011-11-19: 1 mg via INTRAVENOUS
  Filled 2011-11-19: qty 1

## 2011-11-19 MED ORDER — IOHEXOL 300 MG/ML  SOLN
INTRAMUSCULAR | Status: AC
  Filled 2011-11-19: qty 1

## 2011-11-19 MED ORDER — HYDROMORPHONE HCL PF 1 MG/ML IJ SOLN
INTRAMUSCULAR | Status: AC
  Filled 2011-11-19: qty 1

## 2011-11-19 MED ORDER — FENTANYL CITRATE 0.05 MG/ML IJ SOLN
INTRAMUSCULAR | Status: DC | PRN
  Administered 2011-11-19: 100 ug via INTRAVENOUS
  Administered 2011-11-19: 50 ug via INTRAVENOUS
  Administered 2011-11-19: 100 ug via INTRAVENOUS
  Administered 2011-11-19: 50 ug via INTRAVENOUS
  Administered 2011-11-19: 100 ug via INTRAVENOUS
  Administered 2011-11-19: 50 ug via INTRAVENOUS

## 2011-11-19 MED ORDER — LABETALOL HCL 5 MG/ML IV SOLN
INTRAVENOUS | Status: DC | PRN
  Administered 2011-11-19: 5 mg via INTRAVENOUS

## 2011-11-19 MED ORDER — GLYCOPYRROLATE 0.2 MG/ML IJ SOLN
INTRAMUSCULAR | Status: DC | PRN
  Administered 2011-11-19: .8 mg via INTRAVENOUS

## 2011-11-19 MED ORDER — LACTATED RINGERS IV SOLN: INTRAVENOUS | Status: DC

## 2011-11-19 MED ORDER — IBUPROFEN 600 MG PO TABS
600.0000 mg | ORAL_TABLET | Freq: Four times a day (QID) | ORAL | Status: DC | PRN
  Filled 2011-11-19: qty 1

## 2011-11-19 SURGICAL SUPPLY — 55 items
APL SKNCLS STERI-STRIP NONHPOA (GAUZE/BANDAGES/DRESSINGS) ×2
APPLIER CLIP ROT 10 11.4 M/L (STAPLE) ×3
APR CLP MED LRG 11.4X10 (STAPLE) ×2
BAG SPEC RTRVL LRG 6X4 10 (ENDOMECHANICALS) ×2
BENZOIN TINCTURE PRP APPL 2/3 (GAUZE/BANDAGES/DRESSINGS) ×3 IMPLANT
CABLE HIGH FREQUENCY MONO STRZ (ELECTRODE) ×2 IMPLANT
CANISTER SUCTION 2500CC (MISCELLANEOUS) ×3 IMPLANT
CHLORAPREP W/TINT 26ML (MISCELLANEOUS) ×3 IMPLANT
CLIP APPLIE ROT 10 11.4 M/L (STAPLE) ×2 IMPLANT
CLOTH BEACON ORANGE TIMEOUT ST (SAFETY) ×3 IMPLANT
CLSR STERI-STRIP ANTIMIC 1/2X4 (GAUZE/BANDAGES/DRESSINGS) ×2 IMPLANT
COVER MAYO STAND STRL (DRAPES) ×1 IMPLANT
DECANTER SPIKE VIAL GLASS SM (MISCELLANEOUS) ×1 IMPLANT
DRAIN CHANNEL 19F RND (DRAIN) ×2 IMPLANT
DRAPE C-ARM 42X72 X-RAY (DRAPES) ×3 IMPLANT
DRAPE LAPAROSCOPIC ABDOMINAL (DRAPES) ×3 IMPLANT
DRAPE UTILITY XL STRL (DRAPES) ×3 IMPLANT
DRSG TEGADERM 2-3/8X2-3/4 SM (GAUZE/BANDAGES/DRESSINGS) ×9 IMPLANT
DRSG TEGADERM 4X4.75 (GAUZE/BANDAGES/DRESSINGS) ×3 IMPLANT
ELECT REM PT RETURN 9FT ADLT (ELECTROSURGICAL) ×3
ELECTRODE REM PT RTRN 9FT ADLT (ELECTROSURGICAL) ×2 IMPLANT
EVACUATOR DRAINAGE 10X20 100CC (DRAIN) ×1 IMPLANT
EVACUATOR SILICONE 100CC (DRAIN) ×3
FILTER SMOKE EVAC LAPAROSHD (FILTER) ×3 IMPLANT
GAUZE SPONGE 2X2 8PLY STRL LF (GAUZE/BANDAGES/DRESSINGS) ×1 IMPLANT
GLOVE BIO SURGEON STRL SZ7 (GLOVE) ×3 IMPLANT
GLOVE BIOGEL PI IND STRL 7.0 (GLOVE) ×2 IMPLANT
GLOVE BIOGEL PI IND STRL 7.5 (GLOVE) ×2 IMPLANT
GLOVE BIOGEL PI INDICATOR 7.0 (GLOVE) ×1
GLOVE BIOGEL PI INDICATOR 7.5 (GLOVE) ×1
GLOVE SURG SS PI 6.5 STRL IVOR (GLOVE) ×6 IMPLANT
GOWN STRL NON-REIN LRG LVL3 (GOWN DISPOSABLE) ×8 IMPLANT
GOWN STRL REIN XL XLG (GOWN DISPOSABLE) ×3 IMPLANT
HEMOSTAT SNOW SURGICEL 2X4 (HEMOSTASIS) ×2 IMPLANT
IV LACTATED RINGERS 1000ML (IV SOLUTION) ×4 IMPLANT
KIT BASIN OR (CUSTOM PROCEDURE TRAY) ×3 IMPLANT
NS IRRIG 1000ML POUR BTL (IV SOLUTION) ×3 IMPLANT
POUCH SPECIMEN RETRIEVAL 10MM (ENDOMECHANICALS) ×3 IMPLANT
SCISSORS LAP 5X35 DISP (ENDOMECHANICALS) ×2 IMPLANT
SET CHOLANGIOGRAPH MIX (MISCELLANEOUS) ×3 IMPLANT
SET IRRIG TUBING LAPAROSCOPIC (IRRIGATION / IRRIGATOR) ×3 IMPLANT
SOLUTION ANTI FOG 6CC (MISCELLANEOUS) ×3 IMPLANT
SPONGE DRAIN TRACH 4X4 STRL 2S (GAUZE/BANDAGES/DRESSINGS) ×2 IMPLANT
SPONGE GAUZE 2X2 STER 10/PKG (GAUZE/BANDAGES/DRESSINGS) ×1
STRIP CLOSURE SKIN 1/2X4 (GAUZE/BANDAGES/DRESSINGS) ×3 IMPLANT
SUT ETHILON 2 0 PS N (SUTURE) ×2 IMPLANT
SUT MNCRL AB 4-0 PS2 18 (SUTURE) ×3 IMPLANT
TAPE CLOTH SURG 4X10 WHT LF (GAUZE/BANDAGES/DRESSINGS) ×2 IMPLANT
TOWEL OR 17X26 10 PK STRL BLUE (TOWEL DISPOSABLE) ×3 IMPLANT
TOWEL OR NON WOVEN STRL DISP B (DISPOSABLE) ×2 IMPLANT
TRAY LAP CHOLE (CUSTOM PROCEDURE TRAY) ×3 IMPLANT
TROCAR BLADELESS OPT 5 75 (ENDOMECHANICALS) ×6 IMPLANT
TROCAR XCEL BLUNT TIP 100MML (ENDOMECHANICALS) ×3 IMPLANT
TROCAR XCEL NON-BLD 11X100MML (ENDOMECHANICALS) ×3 IMPLANT
TUBING INSUFFLATION 10FT LAP (TUBING) ×3 IMPLANT

## 2011-11-19 NOTE — Transfer of Care (Signed)
Immediate Anesthesia Transfer of Care Note  Patient: Carmen Kelly  Procedure(s) Performed: Procedure(s) (LRB): LAPAROSCOPIC CHOLECYSTECTOMY (N/A)  Patient Location: PACU  Anesthesia Type: General  Level of Consciousness: awake, alert , oriented and patient cooperative  Airway & Oxygen Therapy: Patient Spontanous Breathing and Patient connected to face mask oxygen  Post-op Assessment: Report given to PACU RN and Post -op Vital signs reviewed and stable  Post vital signs: Reviewed and stable  Complications: No apparent anesthesia complications

## 2011-11-19 NOTE — Op Note (Signed)
Laparoscopic Cholecystectomy Procedure Note  Indications: This patient presents with symptomatic gallbladder disease and will undergo laparoscopic cholecystectomy.  Pre-operative Diagnosis: Calculus of gallbladder with acute cholecystitis, without mention of obstruction  Post-operative Diagnosis: Same  Surgeon: Lamaria Hildebrandt K.   Assistants: Gaynelle Adu, MD, FACS  Anesthesia: General endotracheal anesthesia  ASA Class: 2  Procedure Details  The patient was seen again in the Holding Room. The risks, benefits, complications, treatment options, and expected outcomes were discussed with the patient. The possibilities of reaction to medication, pulmonary aspiration, perforation of viscus, bleeding, recurrent infection, finding a normal gallbladder, the need for additional procedures, failure to diagnose a condition, the possible need to convert to an open procedure, and creating a complication requiring transfusion or operation were discussed with the patient. The likelihood of improving the patient's symptoms with return to their baseline status is good.  The patient and/or family concurred with the proposed plan, giving informed consent. The site of surgery properly noted. The patient was taken to Operating Room, identified as Carmen Kelly and the procedure verified as Laparoscopic Cholecystectomy with Intraoperative Cholangiogram. A Time Out was held and the above information confirmed.  Prior to the induction of general anesthesia, antibiotic prophylaxis was administered. General endotracheal anesthesia was then administered and tolerated well. After the induction, the abdomen was prepped with Chloraprep and draped in sterile fashion. The patient was positioned in the supine position.  Local anesthetic agent was injected into the skin near the umbilicus and an incision made. We dissected down to the abdominal fascia with blunt dissection.  The fascia was incised vertically and we entered the  peritoneal cavity bluntly.  A pursestring suture of 0-Vicryl was placed around the fascial opening.  The Hasson cannula was inserted and secured with the stay suture.  Pneumoperitoneum was then created with CO2 and tolerated well without any adverse changes in the patient's vital signs. An 11-mm port was placed in the subxiphoid position.  Two 5-mm ports were placed in the right upper quadrant. All skin incisions were infiltrated with a local anesthetic agent before making the incision and placing the trocars.   We positioned the patient in reverse Trendelenburg, tilted slightly to the patient's left.  The gallbladder was identified, the fundus grasped and retracted cephalad. Adhesions were lysed bluntly and with the electrocautery where indicated, taking care not to injure any adjacent organs or viscus. The infundibulum was grasped and retracted laterally, exposing the peritoneum overlying the triangle of Calot. This was then divided and exposed in a blunt fashion. The cystic duct was clearly identified and bluntly dissected circumferentially. A critical view of the cystic duct and cystic artery was obtained.  The cystic duct was then ligated with clips and divided. The cystic artery was, dissected free, ligated with clips and divided as well.   The gallbladder was dissected from the liver bed in retrograde fashion with the electrocautery. The gallbladder was removed and placed in an Endocatch sac. The liver bed was irrigated and inspected. Hemostasis was achieved with the electrocautery. Copious irrigation was utilized and was repeatedly aspirated until clear.  The gallbladder and Endocatch sac were then removed through the umbilical port site.  The pursestring suture was used to close the umbilical fascia.    We again inspected the right upper quadrant for hemostasis.  Pneumoperitoneum was released as we removed the trocars.  4-0 Monocryl was used to close the skin.   Benzoin, steri-strips, and clean  dressings were applied. The patient was then extubated and brought  to the recovery room in stable condition. Instrument, sponge, and needle counts were correct at closure and at the conclusion of the case.   Findings: Cholecystitis with Cholelithiasis  Estimated Blood Loss: less than 100 mL         Drains: JP drain          Specimens: Gallbladder           Complications: None; patient tolerated the procedure well.         Disposition: PACU - hemodynamically stable.         Condition: stable  Wilmon Arms. Corliss Skains, MD, [facility] Surgery  11/19/2011 4:16 PM

## 2011-11-19 NOTE — Anesthesia Postprocedure Evaluation (Signed)
  Anesthesia Post-op Note  Patient: Carmen Kelly  Procedure(s) Performed: Procedure(s) (LRB): LAPAROSCOPIC CHOLECYSTECTOMY (N/A)  Patient Location: PACU  Anesthesia Type: General  Level of Consciousness: awake and alert   Airway and Oxygen Therapy: Patient Spontanous Breathing  Post-op Pain: mild  Post-op Assessment: Post-op Vital signs reviewed, Patient's Cardiovascular Status Stable, Respiratory Function Stable, Patent Airway and No signs of Nausea or vomiting  Post-op Vital Signs: stable  Complications: No apparent anesthesia complications

## 2011-11-19 NOTE — H&P (View-Only) (Signed)
Patient ID: Carmen Kelly, female   DOB: 1981-04-05, 31 y.o.   MRN: [id number]  Chief Complaint  Patient presents with  . Abdominal Pain    Gallbladder    HPI Carmen Kelly is a 31 y.o. female.  Referred by Dr. Costella Hatcher ED HPI 31 yo female presents with several days of RUQ/ epigastric abdominal pain, bloating, nausea, vomiting that began after eating some greasy food over the weekend.  She has had bloating and shoulder pain for several months, but never this severe.  She was evaluated in the ED and was noted to have cholelithiasis with no sign of cholecystitis.  No past medical history on file.  No past surgical history on file.  Family History  Problem Relation Age of Onset  . Cancer Mother   . Cancer Sister   . Cancer Brother     Social History History  Substance Use Topics  . Smoking status: Never Smoker   . Smokeless tobacco: Not on file  . Alcohol Use: No    No Known Allergies  Current Outpatient Prescriptions  Medication Sig Dispense Refill  . calcium carbonate (TUMS - DOSED IN MG ELEMENTAL CALCIUM) 500 MG chewable tablet Chew 2 tablets by mouth daily.      . ondansetron (ZOFRAN) 4 MG tablet Take 1 tablet (4 mg total) by mouth every 6 (six) hours.  12 tablet  0  . oxyCODONE-acetaminophen (PERCOCET) 5-325 MG per tablet Take 2 tablets by mouth every 4 (four) hours as needed for pain.  15 tablet  0    Review of Systems Review of Systems  Constitutional: Negative for fever, chills and unexpected weight change.  HENT: Negative for hearing loss, congestion, sore throat, trouble swallowing and voice change.   Eyes: Negative for visual disturbance.  Respiratory: Negative for cough and wheezing.   Cardiovascular: Negative.  Negative for chest pain, palpitations and leg swelling.  Gastrointestinal: Positive for nausea, vomiting, abdominal pain, constipation and abdominal distention. Negative for diarrhea, blood in stool and anal bleeding.  Genitourinary: Negative  for hematuria, vaginal bleeding and difficulty urinating.  Musculoskeletal: Negative for arthralgias.  Skin: Negative for rash and wound.  Neurological: Negative for seizures, syncope and headaches.  Hematological: Negative for adenopathy. Does not bruise/bleed easily.  Psychiatric/Behavioral: Negative for confusion.    Blood pressure 120/80, pulse 72, temperature 97.8 F (36.6 C), temperature source Temporal, resp. rate 12, height 5\' 5"  (1.651 m), weight 243 lb 12.8 oz (110.587 kg), last menstrual period 11/06/2011.  Physical Exam Physical Exam WDWN in NAD HEENT:  EOMI, sclera anicteric Neck:  No masses, no thyromegaly Lungs:  CTA bilaterally; normal respiratory effort CV:  Regular rate and rhythm; no murmurs Abd:  +bowel sounds, tender ruq, no palpable masses; no guarding Ext:  Well-perfused; no edema Skin:  Warm, dry; no sign of jaundice  Data Reviewed RADIOLOGY REPORT*  Clinical Data: Right upper quadrant abdominal pain.  COMPLETE ABDOMINAL ULTRASOUND  Comparison: None.  Findings:  Gallbladder: Numerous gallstones. No wall thickening or  pericholecystic fluid. Sonographic Murphy's sign was not elicited.  Common bile duct: Normal, 4 mm.  Liver: Normal in echogenicity, without focal lesion.  IVC: Negative  Pancreas: Poorly visualized due to overlying bowel gas.  Spleen: Normal in size and echogenicity.  Right Kidney: 10.4 cm. No hydronephrosis.  Left Kidney: 11.0 cm. No hydronephrosis.  Abdominal aorta: Nonaneurysmal without ascites.  IMPRESSION:  Cholelithiasis without cholecystitis or biliary ductal dilatation.  Portions of anatomy obscured by overlying bowel gas.  Original  Report Authenticated By: Consuello Bossier, M.D.      Lab Results  Component Value Date   ALT 64* 11/11/2011   AST 51* 11/11/2011   ALKPHOS 97 11/11/2011   BILITOT 0.3 11/11/2011    Lab Results  Component Value Date   WBC 4.9 11/11/2011   HGB 12.8 11/11/2011   HCT 38.2 11/11/2011   MCV 79.7  11/11/2011   PLT 295 11/11/2011      Assessment    Chronic calculus cholecystitis     Plan    Laparoscopic cholecystectomy with intraoperative cholangiogram.  The surgical procedure has been discussed with the patient.  Potential risks, benefits, alternative treatments, and expected outcomes have been explained.  All of the patient's questions at this time have been answered.  The likelihood of reaching the patient's treatment goal is good.  The patient understand the proposed surgical procedure and wishes to proceed.         Aadit Hagood K. 11/13/2011, 2:10 PM

## 2011-11-19 NOTE — Anesthesia Preprocedure Evaluation (Signed)
Anesthesia Evaluation  Patient identified by MRN, date of birth, ID band Patient awake    Reviewed: Allergy & Precautions, H&P , NPO status , Patient's Chart, lab work & pertinent test results  Airway Mallampati: II TM Distance: >3 FB Neck ROM: Full    Dental  (+) Teeth Intact and Caps,    Pulmonary neg pulmonary ROS,  breath sounds clear to auscultation  Pulmonary exam normal       Cardiovascular negative cardio ROS  Rhythm:Regular Rate:Normal     Neuro/Psych  Neuromuscular disease negative neurological ROS  negative psych ROS   GI/Hepatic negative GI ROS, Neg liver ROS, GERD-  ,  Endo/Other  negative endocrine ROS  Renal/GU negative Renal ROS  negative genitourinary   Musculoskeletal negative musculoskeletal ROS (+)   Abdominal   Peds  Hematology negative hematology ROS (+)   Anesthesia Other Findings   Reproductive/Obstetrics negative OB ROS                           Anesthesia Physical Anesthesia Plan  ASA: I  Anesthesia Plan: General   Post-op Pain Management:    Induction: Intravenous  Airway Management Planned: Oral ETT  Additional Equipment:   Intra-op Plan:   Post-operative Plan: Extubation in OR  Informed Consent: I have reviewed the patients History and Physical, chart, labs and discussed the procedure including the risks, benefits and alternatives for the proposed anesthesia with the patient or authorized representative who has indicated his/her understanding and acceptance.   Dental advisory given  Plan Discussed with: CRNA  Anesthesia Plan Comments:         Anesthesia Quick Evaluation

## 2011-11-19 NOTE — Interval H&P Note (Signed)
History and Physical Interval Note:  11/19/2011 12:11 PM  Carmen Kelly  has presented today for surgery, with the diagnosis of chronic calculus cholecystitis  The various methods of treatment have been discussed with the patient and family. After consideration of risks, benefits and other options for treatment, the patient has consented to  Procedure(s) (LRB): LAPAROSCOPIC CHOLECYSTECTOMY WITH INTRAOPERATIVE CHOLANGIOGRAM (N/A) as a surgical intervention .  The patient's history has been reviewed, patient examined, no change in status, stable for surgery.  I have reviewed the patient's chart and labs.  Questions were answered to the patient's satisfaction.     [name]

## 2011-11-20 ENCOUNTER — Encounter (HOSPITAL_COMMUNITY): Payer: Self-pay | Admitting: Surgery

## 2011-11-20 LAB — COMPREHENSIVE METABOLIC PANEL
ALT: 34 U/L (ref 0–35)
AST: 51 U/L — ABNORMAL HIGH (ref 0–37)
Albumin: 2.8 g/dL — ABNORMAL LOW (ref 3.5–5.2)
Calcium: 8.9 mg/dL (ref 8.4–10.5)
Creatinine, Ser: 1.01 mg/dL (ref 0.50–1.10)
GFR calc non Af Amer: 72 mL/min — ABNORMAL LOW (ref >90)
Sodium: 134 mEq/L — ABNORMAL LOW (ref 135–145)
Total Protein: 6.8 g/dL (ref 6.0–8.3)

## 2011-11-20 LAB — CBC
HCT: 32.9 % — ABNORMAL LOW (ref 36.0–46.0)
Hemoglobin: 10.8 g/dL — ABNORMAL LOW (ref 12.0–15.0)
MCH: 26 pg (ref 26.0–34.0)
MCV: 79.3 fL (ref 78.0–100.0)
RBC: 4.15 MIL/uL (ref 3.87–5.11)

## 2011-11-20 MED ORDER — HYDROMORPHONE HCL PF 1 MG/ML IJ SOLN: 1.0000 mg | INTRAMUSCULAR | Status: DC | PRN

## 2011-11-20 NOTE — Progress Notes (Signed)
1 Day Post-Op  Subjective: Still quite sore; no nausea or vomiting. RUQ pain is different than before surgery Voiding  Objective: Vital signs in last 24 hours: Temp:  [96.9 F (36.1 C)-99 F (37.2 C)] 98.4 F (36.9 C) (07/23 0610) Pulse Rate:  [64-87] 64  (07/23 0610) Resp:  [15-18] 18  (07/23 0610) BP: (96-142)/(62-88) 103/70 mmHg (07/23 0610) SpO2:  [97 %-100 %] 97 % (07/23 0610) Weight:  [241 lb 3.3 oz (109.41 kg)] 241 lb 3.3 oz (109.41 kg) (07/22 1756)    Intake/Output from previous day: 07/22 0701 - 07/23 0700 In: 2720 [P.O.:720; I.V.:2000] Out: 2165 [Urine:1950; Drains:115; Blood:100] Intake/Output this shift:   JP output - serosanguinous  General appearance: alert, cooperative and no distress Resp: clear to auscultation bilaterally GI: soft, incisional tenderness Dressings dry; Drain - serosanguinous  Lab Results:   [facility] 11/20/11 0343  WBC 11.5*  HGB 10.8*  HCT 32.9*  PLT 316   BMET  Basename 11/20/11 0343  NA 134*  K 4.1  CL 100  CO2 28  GLUCOSE 155*  BUN 7  CREATININE 1.01  CALCIUM 8.9   Lab Results  Component Value Date   ALT 34 11/20/2011   AST 51* 11/20/2011   ALKPHOS 72 11/20/2011   BILITOT 0.3 11/20/2011    PT/INR No results found for this basename: LABPROT:2,INR:2 in the last 72 hours ABG No results found for this basename: PHART:2,PCO2:2,PO2:2,HCO3:2 in the last 72 hours  Studies/Results: No results found.  Anti-infectives: Anti-infectives     Start     Dose/Rate Route Frequency Ordered Stop   11/19/11 2000   piperacillin-tazobactam (ZOSYN) IVPB 3.375 g        3.375 g 12.5 mL/hr over 240 Minutes Intravenous Every 8 hours 11/19/11 1803     11/19/11 1032   ceFAZolin (ANCEF) 3 g in dextrose 5 % 50 mL IVPB        3 g 160 mL/hr over 30 Minutes Intravenous 60 min pre-op 11/19/11 1032 11/19/11 1353          Assessment/Plan: s/p Procedure(s) (LRB): LAPAROSCOPIC CHOLECYSTECTOMY (N/A) for severe acute cholecystitis Continue  antibiotics Ambulate PO pain meds Probable discharge tomorrow.  LOS: 1 day    Vivyan Biggers K. 11/20/2011

## 2011-11-21 MED ORDER — OXYCODONE-ACETAMINOPHEN 5-325 MG PO TABS: 1.0000 | ORAL_TABLET | ORAL | Status: AC | PRN

## 2011-11-21 NOTE — Progress Notes (Signed)
Pt discharged to home with husband provided discharge instructions and prescriptions along with handouts. Pt verbalized understanding of discharge information. Pt stable. Pt transported by tech IV removed and documented. Pt educated on JP drain emptying and recharging. Pt provided with output sheet to chart JP. Advised pt of follow up appointment on 7/25 at 1130.  Annitta Needs, RN

## 2011-11-21 NOTE — Discharge Summary (Signed)
Physician Discharge Summary  Patient ID: Carmen Kelly MRN: [id number] DOB/AGE: May 13, 1980 31 y.o.  Admit date: 11/19/2011 Discharge date: 11/21/2011  Admission Diagnoses:Acute cholecystitis  Discharge Diagnoses: Same          Active Problems:  * No active hospital problems. *    Discharged Condition: good  Hospital Course: Laparoscopic cholecystectomy on 11/19/11.  Very inflamed gallbladder - difficult resection.  Unable to perform cholangiogram.  Drain left in gallbladder fossa.  Post-op labs showed normal bilirubin.  Patient's pain is controlled with PO pain meds and she is tolerating regular diet.  Consults: None  Significant Diagnostic Studies: none  Treatments: surgery: lap chole  Discharge Exam: Blood pressure 111/75, pulse 77, temperature 98.5 F (36.9 C), temperature source Oral, resp. rate 18, height 5\' 5"  (1.651 m), weight 241 lb 3.3 oz (109.41 kg), last menstrual period 11/06/2011, SpO2 97.00%. General appearance: alert, cooperative and no distress Resp: clear to auscultation bilaterally GI: soft; incisional tenderness Dressing dry; drain with serosanguinous output  Disposition: 01-Home or Self Care  Discharge Orders    Future Appointments: Provider: Department: Dept Phone: Center:   12/04/2011 10:00 AM Wilmon Arms. Gwendolyne Welford, MD [facility] [phone] None   12/25/2011 10:30 AM Etta Grandchild, MD [facility] [phone] [facility]     Future Orders Please Complete By Expires   Diet general      Increase activity slowly      May walk up steps      May shower / Bathe      Driving Restrictions      Comments:   Do not drive while taking pain medications   Call MD for:  temperature >100.4      Call MD for:  persistant nausea and vomiting      Call MD for:  severe uncontrolled pain      Call MD for:  redness, tenderness, or signs of infection (pain, swelling, redness, odor or green/yellow discharge around incision site)       Use stool softeners/ laxatives as needed to  have bowel movements.  Medication List  As of 11/21/2011  8:28 AM   TAKE these medications         calcium carbonate 500 MG chewable tablet   Commonly known as: TUMS - dosed in mg elemental calcium   Chew 2 tablets by mouth daily.      oxyCODONE-acetaminophen 5-325 MG per tablet   Commonly known as: PERCOCET/ROXICET   Take 2 tablets by mouth every 4 (four) hours as needed for pain.      oxyCODONE-acetaminophen 5-325 MG per tablet   Commonly known as: PERCOCET/ROXICET   Take 1-2 tablets by mouth every 4 (four) hours as needed.      polyethylene glycol powder powder   Commonly known as: GLYCOLAX/MIRALAX   Take 17 g by mouth daily.         ASK your doctor about these medications         ondansetron 4 MG tablet   Commonly known as: ZOFRAN   Take 1 tablet (4 mg total) by mouth every 6 (six) hours.           Follow-up Information    Follow up with Wynona Luna., MD. (My office will call you to schedule)    Contact information:   [facility] [address], [address] [place] [address] [phone]          Signed: Wynona Luna. 11/21/2011, 8:28 AM

## 2011-11-21 NOTE — Progress Notes (Signed)
Utilization review completed.  

## 2011-11-22 ENCOUNTER — Ambulatory Visit (INDEPENDENT_AMBULATORY_CARE_PROVIDER_SITE_OTHER): Payer: PRIVATE HEALTH INSURANCE | Admitting: Surgery

## 2011-11-22 ENCOUNTER — Encounter (INDEPENDENT_AMBULATORY_CARE_PROVIDER_SITE_OTHER): Payer: Self-pay | Admitting: Surgery

## 2011-11-22 VITALS — BP 124/84 | HR 81 | Temp 98.6°F | Ht 65.0 in | Wt 245.2 lb

## 2011-11-22 DIAGNOSIS — K8010 Calculus of gallbladder with chronic cholecystitis without obstruction: Secondary | ICD-10-CM

## 2011-11-22 NOTE — Progress Notes (Signed)
Status post laparoscopic cholecystectomy on 11/19/11. The patient had acute calculus cholecystitis. She was hospitalized for 2 days postoperatively and was discharged yesterday with the drain in place. The drain has only put out about 20 cc of serosanguineous fluid. She still has some soreness in her right upper quadrant but is tolerating a diet and denies any nausea. She is still using some pain medication.   Filed Vitals:   11/22/11 1141  BP: 124/84  Pulse: 81  Temp: 98.6 F (37 C)    Her incisions are well healed with no sign of infection. The drain is easily removed without difficulty. A dry dressing was placed.  Impression: Acute cholecystitis Plan: P.r.n. pain medication. Slowly increase diet. Followup 2 weeks.  Wilmon Arms. Corliss Skains, MD, [facility]  11/22/2011 12:43 PM

## 2011-12-04 ENCOUNTER — Ambulatory Visit (INDEPENDENT_AMBULATORY_CARE_PROVIDER_SITE_OTHER): Payer: PRIVATE HEALTH INSURANCE | Admitting: Surgery

## 2011-12-04 ENCOUNTER — Encounter (INDEPENDENT_AMBULATORY_CARE_PROVIDER_SITE_OTHER): Payer: Self-pay | Admitting: Surgery

## 2011-12-04 VITALS — BP 116/82 | HR 81 | Temp 96.8°F | Ht 65.0 in | Wt 238.6 lb

## 2011-12-04 DIAGNOSIS — K8010 Calculus of gallbladder with chronic cholecystitis without obstruction: Secondary | ICD-10-CM

## 2011-12-04 NOTE — Progress Notes (Signed)
Status post left Cholecystectomy on 11/19/11 for acute cholecystitis. The patient is doing much better. She still has some soreness at the most lateral port in the right upper quadrant. This is where the drain was removed. The rest of her incisions are well-healed no sign of infection. The drain site shows no sign of infection. She has some muscular soreness underneath it. Appetite and bowel movements are normal. She denies any diarrhea.  She may resume full activity. Followup on a p.r.n. basis.  Filed Vitals:   12/04/11 0955  BP: 116/82  Pulse: 81  Temp: 96.8 F (36 C)   Dottie Vaquerano K. Corliss Skains, MD, [facility]  12/04/2011 10:07 AM

## 2011-12-25 ENCOUNTER — Ambulatory Visit: Payer: Self-pay | Admitting: Internal Medicine

## 2011-12-25 DIAGNOSIS — Z0289 Encounter for other administrative examinations: Secondary | ICD-10-CM

## 2012-04-25 ENCOUNTER — Inpatient Hospital Stay (HOSPITAL_COMMUNITY): Payer: Medicaid Other

## 2012-04-25 ENCOUNTER — Encounter (HOSPITAL_COMMUNITY): Payer: Self-pay

## 2012-04-25 ENCOUNTER — Inpatient Hospital Stay (HOSPITAL_COMMUNITY)
Admission: AD | Admit: 2012-04-25 | Discharge: 2012-04-25 | Disposition: A | Discharge status: Home / Self Care | Payer: Medicaid Other | Source: Ambulatory Visit | Attending: Obstetrics & Gynecology | Admitting: Obstetrics & Gynecology

## 2012-04-25 DIAGNOSIS — O200 Threatened abortion: Secondary | ICD-10-CM

## 2012-04-25 DIAGNOSIS — O26859 Spotting complicating pregnancy, unspecified trimester: Secondary | ICD-10-CM | POA: Insufficient documentation

## 2012-04-25 DIAGNOSIS — O209 Hemorrhage in early pregnancy, unspecified: Secondary | ICD-10-CM

## 2012-04-25 HISTORY — DX: Gestational diabetes mellitus in pregnancy, unspecified control: O24.419

## 2012-04-25 LAB — URINALYSIS, ROUTINE W REFLEX MICROSCOPIC
Glucose, UA: NEGATIVE mg/dL
Hgb urine dipstick: NEGATIVE
Ketones, ur: NEGATIVE mg/dL
Leukocytes, UA: NEGATIVE
pH: 5.5 (ref 5.0–8.0)

## 2012-04-25 LAB — CBC
HCT: 37 % (ref 36.0–46.0)
Hemoglobin: 12.9 g/dL (ref 12.0–15.0)
MCH: 27.9 pg (ref 26.0–34.0)
RBC: 4.62 MIL/uL (ref 3.87–5.11)

## 2012-04-25 LAB — OB RESULTS CONSOLE ABO/RH: RH Type: NEGATIVE

## 2012-04-25 LAB — WET PREP, GENITAL

## 2012-04-25 MED ORDER — RHO D IMMUNE GLOBULIN 1500 UNIT/2ML IJ SOLN
300.0000 ug | Freq: Once | INTRAMUSCULAR | Status: AC
  Administered 2012-04-25: 300 ug via INTRAMUSCULAR
  Filled 2012-04-25: qty 2

## 2012-04-25 NOTE — MAU Provider Note (Signed)
History     CSN: [id number]  Arrival date and time: 04/25/12 1219   None     Chief Complaint  Patient presents with  . Vaginal Bleeding   HPI 31 y.o. [id number] at around 13 weeks by LMP (around 9/22-25/13). No prenatal care yet. Last pregnancy 6 years ago. C/O spotting today. No cramping/pain. Does have pelvic pain since early pregnancy but no specific abdominal pain. No fever/chills, nausea/vomiting. Is Rh negative, has spotting in last pregnancy and got Rhogam early on.  OB History    Grav Para Term Preterm Abortions TAB SAB Ect Mult Living   6 3 3  2 1 1   3     Hx GDM with 2nd pregnancy, GHTN with first.  Past Medical History  Diagnosis Date  . Cholelithiasis   . GERD (gastroesophageal reflux disease)   . Gestational diabetes     with 2 nd baby    Past Surgical History  Procedure Date  . Wisdom tooth extraction   . Cholecystectomy 11/19/2011    Procedure: LAPAROSCOPIC CHOLECYSTECTOMY;  Surgeon: Wilmon Arms. Corliss Skains, MD;  Location: [facility];  Service: General;  Laterality: N/A;  . Cholecystectomy     Family History  Problem Relation Age of Onset  . Cancer Mother   . Cancer Sister   . Cancer Brother     History  Substance Use Topics  . Smoking status: Never Smoker   . Smokeless tobacco: Not on file  . Alcohol Use: No    Allergies: No Known Allergies  No prescriptions prior to admission  prenatal vitamins  Review of Systems  Constitutional: Negative for fever and chills.  Eyes: Negative for blurred vision and double vision.  Gastrointestinal: Negative for nausea, vomiting, abdominal pain, diarrhea and constipation.  Genitourinary: Negative for dysuria and urgency.  Neurological: Negative for headaches.   Physical Exam   Blood pressure 105/54, pulse 100, temperature 98.6 F (37 C), temperature source Oral, resp. rate 18, last menstrual period 01/27/2012.  Physical Exam  Constitutional: She is oriented to person, place, and time. She appears well-developed  and well-nourished. No distress.  HENT:  Head: Normocephalic and atraumatic.  Eyes: Conjunctivae normal and EOM are normal.  Neck: Normal range of motion. Neck supple.  Cardiovascular: Normal rate.   Respiratory: Effort normal.  GI: Soft. There is no tenderness. There is no rebound and no guarding.  Genitourinary: Vagina normal. No vaginal discharge found.       Normal external genitalia and vagina. Normal cervix, closed, ectropion noted. Mucous with small streaks of blood coming from cervical os. No CMT.  Musculoskeletal: Normal range of motion. She exhibits no edema and no tenderness.  Neurological: She is alert and oriented to person, place, and time.  Skin: Skin is warm and dry.  Psychiatric: She has a normal mood and affect.   Results for orders placed during the hospital encounter of 04/25/12 (from the past 24 hour(s))  OB RESULTS CONSOLE ABO/RH     Status: Normal      Component Value Range   RH Type  Negative     ABO Grouping A    URINALYSIS, ROUTINE W REFLEX MICROSCOPIC     Status: Normal   Collection Time   04/25/12 12:37 PM      Component Value Range   Color, Urine YELLOW  YELLOW   APPearance CLEAR  CLEAR   Specific Gravity, Urine 1.020  1.005 - 1.030   pH 5.5  5.0 - 8.0   Glucose, UA NEGATIVE  NEGATIVE mg/dL   Hgb urine dipstick NEGATIVE  NEGATIVE   Bilirubin Urine NEGATIVE  NEGATIVE   Ketones, ur NEGATIVE  NEGATIVE mg/dL   Protein, ur NEGATIVE  NEGATIVE mg/dL   Urobilinogen, UA 0.2  0.0 - 1.0 mg/dL   Nitrite NEGATIVE  NEGATIVE   Leukocytes, UA NEGATIVE  NEGATIVE  POCT PREGNANCY, URINE     Status: Abnormal   Collection Time   04/25/12 12:57 PM      Component Value Range   Preg Test, Ur POSITIVE (*) NEGATIVE  CBC     Status: Normal   Collection Time   04/25/12  1:25 PM      Component Value Range   WBC 6.5  4.0 - 10.5 K/uL   RBC 4.62  3.87 - 5.11 MIL/uL   Hemoglobin 12.9  12.0 - 15.0 g/dL   HCT 60.4  54.0 - 98.1 %   MCV 80.1  78.0 - 100.0 fL   MCH 27.9  26.0  - 34.0 pg   MCHC 34.9  30.0 - 36.0 g/dL   RDW 19.1  47.8 - 29.5 %   Platelets 225  150 - 400 K/uL  RH IG WORKUP (INCLUDES ABO/RH)     Status: Normal (Preliminary result)   Collection Time   04/25/12  1:25 PM      Component Value Range   Gestational Age(Wks) 13     ABO/RH(D) A NEG     Antibody Screen NEG     Unit Number [id number]     Blood Component Type RHIG     Unit division 00     Status of Unit ALLOCATED     Transfusion Status OK TO TRANSFUSE    WET PREP, GENITAL     Status: Abnormal   Collection Time   04/25/12  2:20 PM      Component Value Range   Yeast Wet Prep HPF POC NONE SEEN  NONE SEEN   Trich, Wet Prep NONE SEEN  NONE SEEN   Clue Cells Wet Prep HPF POC NONE SEEN  NONE SEEN   WBC, Wet Prep HPF POC FEW (*) NONE SEEN   Ultrasound:  Single IUP measuring [redacted]w[redacted]d, no subchorionic hemorrhage, FHR 153, Simple cyst on left ovary measuring 5 x 5.6 x 5.4 cm.  MAU Course  Procedures  Rhogam given in MAU.  Assessment and Plan  31 y.o. [id number] at [redacted]w[redacted]d with vaginal spotting. - Viable IUP, threatened miscarriage.  - Rh negative - Rhogam given - Establish care with OB as soon as possible - Bleeding precautions discussed.  Napoleon Form, MD 04/25/2012 5:53 PM

## 2012-04-25 NOTE — MAU Note (Signed)
Patient is in with c/o vaginal bleeding that started this morning. Patient does not have a pad on, she notices the blood when she wipes. She denies any pain.

## 2012-04-26 LAB — GC/CHLAMYDIA PROBE AMP: CT Probe RNA: NEGATIVE

## 2012-04-26 LAB — RH IG WORKUP (INCLUDES ABO/RH)
ABO/RH(D): A NEG
Gestational Age(Wks): 13

## 2012-04-29 NOTE — MAU Provider Note (Signed)
Attestation of Attending Supervision of Advanced Practitioner (CNM/NP): Evaluation and management procedures were performed by the Advanced Practitioner under my supervision and collaboration. I have reviewed the Advanced Practitioner's note and chart, and I agree with the management and plan.  Parker Sawatzky H. 7:53 PM   

## 2012-04-30 NOTE — L&D Delivery Note (Signed)
Delivery Note Cx c/c/+2 at 1438.  Prepped for delivery and cart set up over next half hr.  Pt w/o rectal pressure or urge to push.  Around 0300, placed in lithotomy position, and when knees flexed, fetal head spontaneously delivered; lowered HOB,and asked pt to voluntarily push to deliver shoulders and body followed.  SVD at 3:03 PM, a viable female "Addison" was delivered via Vaginal, Spontaneous Delivery (Presentation: Right Occiput Anterior).   APGAR: 9, 9; weight 9 lb 0.8 oz (4105 g).  Newborn immediately placed on mom's abdomen, where dried and stimulated, and spontaneous cry.  Cord doubly clamped and cut by pt.  Pitocin bolus started within next few minutes following delivery.  Cord blood obtained.  No placental blushes by about 15 minutes post delivery.  Applied gentle traction to cord several times and cord lengthened, but placenta did not follow.  Asked pt to push and no benefit.  As traction further applied, cord cont'd to lengthen, but started to evulse, and appeared to be velamentous insertion b/c a trail of membranes appeared connected to end of normal spiraled cord.  Upon SSE, b/c of lax vaginal walls, unable to visualize os to attempt to grasp placenta w/ instrument.  Following attempt at SSE, Dr. Stefano Gaul called to [facility] at 1524 for assistance secondary to retained placenta.  VB stable at that time.  Please see his dictation r/e manual removal and curettage.  Placenta status: Abnormal, Manual removal, followed Curettage in L&D.  Cord: 3 vessels with the following complications: .  Cord pH: not collected.  Anesthesia: Epidural  Episiotomy: None Lacerations: None Suture Repair: n/a Est. Blood Loss (mL): 450  Mom to postpartum.  Baby to nursery-stable. Pt plans to BF.  Pt received add'l 1gm Ancef during curettage IV.  Will do one add'l IV dose of Ancef 3gm at 2200 tonight. Will start on po iron bid.  Per AVS, will do po methergine series.  CTO BP carefully.  D/c Glyburide and start daily  fasting cbg. Mom, newborn, FOB, and older 3 children all bonding appropriately.  Tedford Berg H 10/26/2012, 5:03 PM

## 2012-05-29 ENCOUNTER — Ambulatory Visit: Payer: Medicaid Other | Admitting: Obstetrics and Gynecology

## 2012-05-29 DIAGNOSIS — Z331 Pregnant state, incidental: Secondary | ICD-10-CM

## 2012-05-29 LAB — POCT URINALYSIS DIPSTICK
Leukocytes, UA: NEGATIVE
Nitrite, UA: NEGATIVE
Protein, UA: NEGATIVE
Urobilinogen, UA: NEGATIVE

## 2012-05-29 NOTE — Progress Notes (Signed)
NOB interview completed.  Pt has not had any previous prenatal care.  Per VL can decline GC/CT urine culture today, pt had done @ [facility] on 04/25/12 d/t her spotting.  NOB work up scheduled for Wednesday 06/04/12 w/ AVS.  Pt requests Quad screen which was done w/ pt's prenatal labs today.

## 2012-05-30 ENCOUNTER — Encounter: Payer: Self-pay | Admitting: Obstetrics and Gynecology

## 2012-05-30 DIAGNOSIS — Z8632 Personal history of gestational diabetes: Secondary | ICD-10-CM

## 2012-05-30 DIAGNOSIS — O139 Gestational [pregnancy-induced] hypertension without significant proteinuria, unspecified trimester: Secondary | ICD-10-CM | POA: Insufficient documentation

## 2012-05-30 DIAGNOSIS — O26899 Other specified pregnancy related conditions, unspecified trimester: Secondary | ICD-10-CM

## 2012-05-30 DIAGNOSIS — O09299 Supervision of pregnancy with other poor reproductive or obstetric history, unspecified trimester: Secondary | ICD-10-CM | POA: Insufficient documentation

## 2012-05-30 DIAGNOSIS — O4690 Antepartum hemorrhage, unspecified, unspecified trimester: Secondary | ICD-10-CM | POA: Insufficient documentation

## 2012-05-30 DIAGNOSIS — Z6791 Unspecified blood type, Rh negative: Secondary | ICD-10-CM | POA: Insufficient documentation

## 2012-05-30 LAB — AFP, QUAD SCREEN
AFP: 35.9 IU/mL
Curr Gest Age: 17.5 wks.days
Interpretation-AFP: NEGATIVE
uE3 Mom: 1.36
uE3 Value: 0.9 ng/mL

## 2012-05-30 LAB — PRENATAL PANEL VII
Eosinophils Absolute: 0.1 10*3/uL (ref 0.0–0.7)
HIV: NONREACTIVE
Hemoglobin: 12.2 g/dL (ref 12.0–15.0)
Hepatitis B Surface Ag: NEGATIVE
Lymphs Abs: 1.5 10*3/uL (ref 0.7–4.0)
MCH: 27.9 pg (ref 26.0–34.0)
MCV: 82 fL (ref 78.0–100.0)
Monocytes Relative: 7 % (ref 3–12)
Neutrophils Relative %: 73 % (ref 43–77)
RBC: 4.38 MIL/uL (ref 3.87–5.11)
Rubella: 12.4 Index — ABNORMAL HIGH (ref <0.90)

## 2012-05-30 LAB — ANTIBODY TITER (PRENATAL TITER): Ab Titer: <2

## 2012-05-31 LAB — CULTURE, OB URINE
Colony Count: NO GROWTH
Organism ID, Bacteria: NO GROWTH

## 2012-06-02 ENCOUNTER — Encounter: Payer: Self-pay | Admitting: Obstetrics and Gynecology

## 2012-06-02 DIAGNOSIS — R760 Raised antibody titer: Secondary | ICD-10-CM | POA: Insufficient documentation

## 2012-06-02 DIAGNOSIS — D573 Sickle-cell trait: Secondary | ICD-10-CM | POA: Insufficient documentation

## 2012-06-02 LAB — HEMOGLOBINOPATHY EVALUATION
Hemoglobin Other: 0 %
Hgb A: 60.1 % — ABNORMAL LOW (ref 96.8–97.8)

## 2012-06-04 ENCOUNTER — Ambulatory Visit: Payer: Medicaid Other | Admitting: Obstetrics and Gynecology

## 2012-06-04 ENCOUNTER — Encounter: Payer: Self-pay | Admitting: Obstetrics and Gynecology

## 2012-06-04 VITALS — BP 114/74 | Wt 253.0 lb

## 2012-06-04 DIAGNOSIS — Z124 Encounter for screening for malignant neoplasm of cervix: Secondary | ICD-10-CM

## 2012-06-04 DIAGNOSIS — Z331 Pregnant state, incidental: Secondary | ICD-10-CM

## 2012-06-04 DIAGNOSIS — Z3490 Encounter for supervision of normal pregnancy, unspecified, unspecified trimester: Secondary | ICD-10-CM

## 2012-06-04 DIAGNOSIS — Z3689 Encounter for other specified antenatal screening: Secondary | ICD-10-CM

## 2012-06-04 LAB — POCT WET PREP (WET MOUNT)
Bacteria Wet Prep HPF POC: NEGATIVE
pH: 4.5

## 2012-06-04 NOTE — Progress Notes (Addendum)
[facility] NEW OB EXAMINATION  Today's date: June 04, 2012   Carmen Kelly is a 32 y.o. female, [id number], who presents at [redacted]w[redacted]d gestation for a new obstetrical examination.  The patient says she is certain of her last menstrual period and that it was normal on January 27, 2012.  Her due date is November 02, 2012.  She had an ultrasound on April 25, 2013 at 13 weeks and 1 day that confirmed her gestation.  The patient has a history of sickle cell trait, pregnancy-induced hypertension, obesity, gestational diabetes, and blood type A-.  The patient was seen at the hospital in December 2014 because of bleeding.  She was given RhoGAM.  The patient does not want any more children after this pregnancy.  She is considering vasectomy.  The following portions of the patient's history were reviewed and updated as appropriate: allergies, current medications, past family history, past medical history, past social history, past surgical history and problem list.  OB History    Grav Para Term Preterm Abortions TAB SAB Ect Mult Living   6 3 3  2 1 1   3       Past Medical History  Diagnosis Date  . Cholelithiasis   . GERD (gastroesophageal reflux disease)   . Gestational diabetes     with 2 nd baby  . Infection     Yeast;not frequent  . GDM (gestational diabetes mellitus) 2003    2nd pregnancy  . Swelling 2001    During first pregnancy;increased swelling  . Blood type, Rh negative     Needed rhogam 04/25/12 for spotting  . Sickle cell trait     Past Surgical History  Procedure Date  . Wisdom tooth extraction   . Cholecystectomy 11/19/2011    Procedure: LAPAROSCOPIC CHOLECYSTECTOMY;  Surgeon: Wilmon Arms. Corliss Skains, MD;  Location: [facility];  Service: General;  Laterality: N/A;  . Cholecystectomy     Family History  Problem Relation Age of Onset  . Adopted: Yes  . Cancer Mother     Cervical  . Cancer Sister     Colon  . Cancer Brother     Colon  . Cancer Maternal Grandmother    Breast  . Cancer Brother     Colon  . Sickle cell trait Son   . Seizures Other     Neice  . Anemia Daughter   . Migraines Son   . Asthma Son   . Irritable bowel syndrome Sister   . ADD / ADHD Son     Social History:  reports that she has never smoked. She has never used smokeless tobacco. She reports that she does not drink alcohol or use illicit drugs.  Allergies: No Known Allergies  Medications: prenatal vitamins   Objective:    BP 114/74  Wt 253 lb (114.76 kg)  LMP 01/27/2012    Weight:  Wt Readings from Last 1 Encounters:  06/04/12 253 lb (114.76 kg)          BMI: There is no height on file to calculate BMI.  General Appearance: Alert, appropriate appearance for age. No acute distress HEENT: Grossly normal Neck / Thyroid: Supple, no masses, nodes or enlargement Lungs: clear to auscultation bilaterally Back: No CVA tenderness Breast Exam: No masses or nodes.No dimpling, nipple retraction or discharge. Cardiovascular: Regular rate and rhythm. S1, S2, no murmur Gastrointestinal: Soft, non-tender, no masses or organomegaly.  Fundal height: 20 weeks                               Fetal heart tones audible: yes, 150 bpm.  ++++++++++++++++++++++++++++++++++++++++++++++++++++++++  Pelvic Exam: External genitalia: normal general appearance Vaginal: normal without tenderness, induration or masses and relaxation: Yes Cervix: normal appearance Adnexa: normal bimanual exam Uterus: gravid, nontender, 20 weeks size  ++++++++++++++++++++++++++++++++++++++++++++++++++++++++  Lymphatic Exam: Non-palpable nodes in neck, clavicular, axillary, or inguinal regions Neurologic: Normal speech, no tremor  Psychiatric: Alert and oriented, appropriate affect.  Prenatal labs: ABO, Rh: A/NEG/-- (01/30 1434) Antibody: POS (01/30 1434) Rubella:  immune RPR: NON REAC (01/30 1434)  HBsAg: NEGATIVE (01/30 1434)  HIV: NON REACTIVE (01/30 1434)  GBS:   pending  until the third trimester Gonorrhea: Negative Chlamydia: Negative Urine culture: Negative Hemoglobin: 12.2 Platelets: 264,000 Quad screen: Negative  Wet Prep:   Previously done:            yes                     If no: Whiff:                     Negative                              Clue cells:             no                              PH:                        4.5                              Yeast:                    no                              Trichomoniasis:    no   Assessment:   32 y.o. female [id number] at [redacted]w[redacted]d gestation ( EDC is November 02, 2012) by: Normal Last menstrual period: yes                        Obesity  Rh-  History of pregnancy-induced hypertension  History of gestational diabetes  Sickle cell trait  Late prenatal care  Desires vasectomy   Plan:    Pap smear sent.  Anatomy ultrasound in 2 weeks.  Glucola screen next visit because of a history of gestational diabetes.  Antibody screen at [redacted] weeks gestation.  Repeat RhoGAM.  We discussed routine pregnancy issues:  Toxoplasmosis was reviewed.  The patient was told to avoid cat liter boxes and feces.  The patient was told to avoid predator fish including tuna because of our concerns for mercury consumption.  The patient was told to avoid soft cheeses.  The patient was told to be sure that all lunch meats are well cooked.  Genetic screening was discussed. Normal quad screen.  Our model for pregnancy management was reviewed.  Proper diet and exercise reviewed.  Return to office in 2  weeks.  Medications include:  Prenatal vitamins  ABC's of pregnancy given.  Mylinda Latina.D.

## 2012-06-04 NOTE — Progress Notes (Signed)
[redacted]w[redacted]d  last pap : 2012 Pt has no concerns today.

## 2012-06-04 NOTE — Progress Notes (Signed)
[redacted]w[redacted]d

## 2012-06-05 LAB — PAP IG W/ RFLX HPV ASCU

## 2012-06-14 ENCOUNTER — Other Ambulatory Visit: Payer: Self-pay

## 2012-06-17 ENCOUNTER — Ambulatory Visit: Payer: Medicaid Other

## 2012-06-17 ENCOUNTER — Encounter: Payer: Self-pay | Admitting: Obstetrics and Gynecology

## 2012-06-17 ENCOUNTER — Ambulatory Visit: Payer: Medicaid Other | Admitting: Obstetrics and Gynecology

## 2012-06-17 VITALS — BP 104/62 | Wt 252.0 lb

## 2012-06-17 DIAGNOSIS — Z3689 Encounter for other specified antenatal screening: Secondary | ICD-10-CM

## 2012-06-17 DIAGNOSIS — Z1389 Encounter for screening for other disorder: Secondary | ICD-10-CM

## 2012-06-17 DIAGNOSIS — Z331 Pregnant state, incidental: Secondary | ICD-10-CM

## 2012-06-17 NOTE — Progress Notes (Signed)
[redacted]w[redacted]d Pt has no complaints

## 2012-06-17 NOTE — Progress Notes (Signed)
[redacted]w[redacted]d Ultrasound: Single gestation, normal fluid, normal anatomy, 400 g (59th percentile), cervix 3.95 cm, believe to be a female, normal placenta. Return to office in 4 weeks. Glucola next visit. Urine analysis is next visit because of a history of sickle cell trait. Dr. Stefano Gaul

## 2012-06-19 LAB — US OB COMP + 14 WK

## 2012-06-22 ENCOUNTER — Encounter (HOSPITAL_COMMUNITY): Payer: Self-pay | Admitting: Obstetrics and Gynecology

## 2012-06-22 ENCOUNTER — Inpatient Hospital Stay (HOSPITAL_COMMUNITY)
Admission: AD | Admit: 2012-06-22 | Discharge: 2012-06-22 | Disposition: A | Discharge status: Home / Self Care | Payer: No Typology Code available for payment source | Source: Ambulatory Visit | Attending: Obstetrics and Gynecology | Admitting: Obstetrics and Gynecology

## 2012-06-22 DIAGNOSIS — O99891 Other specified diseases and conditions complicating pregnancy: Secondary | ICD-10-CM | POA: Insufficient documentation

## 2012-06-22 LAB — URINALYSIS, ROUTINE W REFLEX MICROSCOPIC
Bilirubin Urine: NEGATIVE
Glucose, UA: NEGATIVE mg/dL
Ketones, ur: NEGATIVE mg/dL
Nitrite: NEGATIVE
pH: 5.5 (ref 5.0–8.0)

## 2012-06-22 MED ORDER — IBUPROFEN 800 MG PO TABS
800.0000 mg | ORAL_TABLET | Freq: Once | ORAL | Status: AC
  Administered 2012-06-22: 800 mg via ORAL
  Filled 2012-06-22: qty 1
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
## 2012-06-23 LAB — RH IG WORKUP (INCLUDES ABO/RH)
Antibody Screen: POSITIVE
Fetal Screen: NEGATIVE

## 2012-07-17 ENCOUNTER — Encounter: Payer: Self-pay | Admitting: Obstetrics and Gynecology

## 2012-07-17 ENCOUNTER — Ambulatory Visit: Payer: Medicaid Other | Admitting: Obstetrics and Gynecology

## 2012-07-17 VITALS — BP 114/60 | Wt 257.0 lb

## 2012-07-17 DIAGNOSIS — Z331 Pregnant state, incidental: Secondary | ICD-10-CM

## 2012-07-17 DIAGNOSIS — IMO0002 Reserved for concepts with insufficient information to code with codable children: Secondary | ICD-10-CM

## 2012-07-17 LAB — CBC
MCHC: 34.1 g/dL (ref 30.0–36.0)
Platelets: 240 10*3/uL (ref 150–400)
RDW: 15.4 % (ref 11.5–15.5)
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
## 2012-07-18 LAB — RPR

## 2012-07-18 LAB — GLUCOSE TOLERANCE, 1 HOUR (50G) W/O FASTING: Glucose, 1 Hour GTT: 144 mg/dL — ABNORMAL HIGH (ref 70–140)

## 2012-07-31 ENCOUNTER — Ambulatory Visit: Payer: Medicaid Other | Attending: Obstetrics and Gynecology | Admitting: Physical Therapy

## 2012-07-31 DIAGNOSIS — IMO0001 Reserved for inherently not codable concepts without codable children: Secondary | ICD-10-CM | POA: Insufficient documentation

## 2012-07-31 DIAGNOSIS — M549 Dorsalgia, unspecified: Secondary | ICD-10-CM | POA: Insufficient documentation

## 2012-07-31 DIAGNOSIS — O9989 Other specified diseases and conditions complicating pregnancy, childbirth and the puerperium: Secondary | ICD-10-CM | POA: Insufficient documentation

## 2012-07-31 DIAGNOSIS — R609 Edema, unspecified: Secondary | ICD-10-CM | POA: Insufficient documentation

## 2012-07-31 DIAGNOSIS — M899 Disorder of bone, unspecified: Secondary | ICD-10-CM | POA: Insufficient documentation

## 2012-07-31 DIAGNOSIS — M259 Joint disorder, unspecified: Secondary | ICD-10-CM | POA: Insufficient documentation

## 2012-08-06 ENCOUNTER — Ambulatory Visit: Payer: Medicaid Other | Admitting: Physical Therapy

## 2012-08-13 ENCOUNTER — Ambulatory Visit: Payer: Medicaid Other | Admitting: Physical Therapy

## 2012-08-20 ENCOUNTER — Ambulatory Visit: Payer: Medicaid Other | Admitting: Physical Therapy

## 2012-09-02 IMAGING — US US ABDOMEN COMPLETE
1 series · 14 of 25 positions shown · non-contrast
Comparison: None.

CLINICAL DATA: Right upper quadrant abdominal pain.

COMPLETE ABDOMINAL ULTRASOUND

[Series 1: us abdomen complete · 0.32mm/px · 14 of 77 slices shown]
[im 1/77]
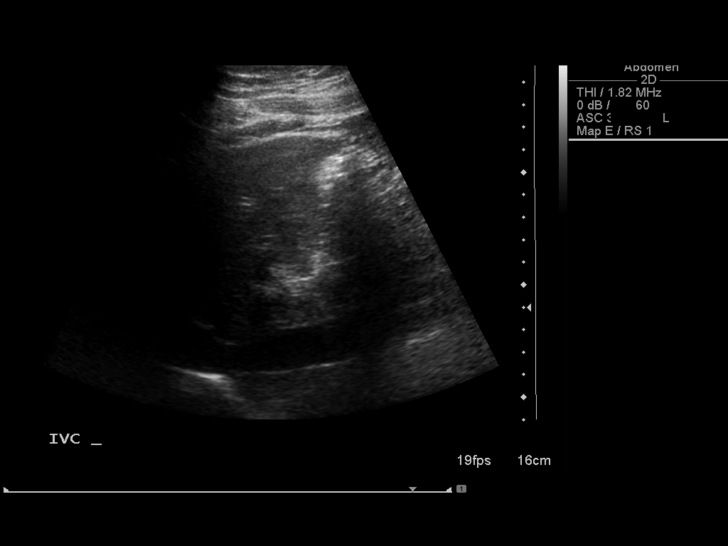
[im 7/77]
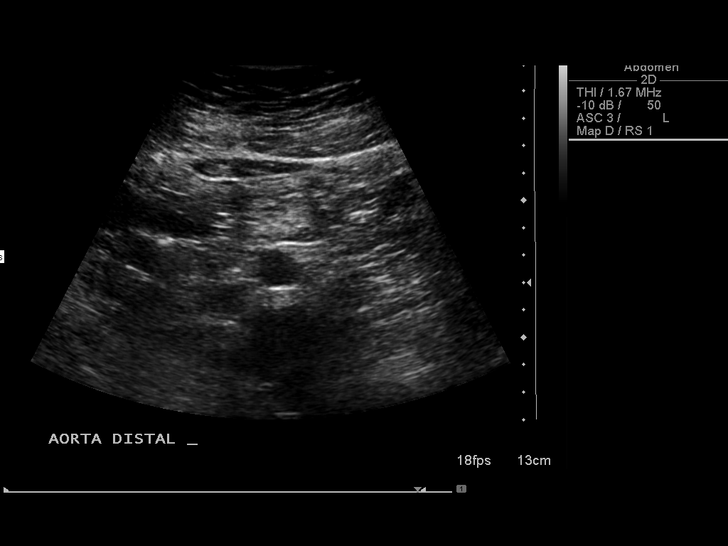
[im 13/77]
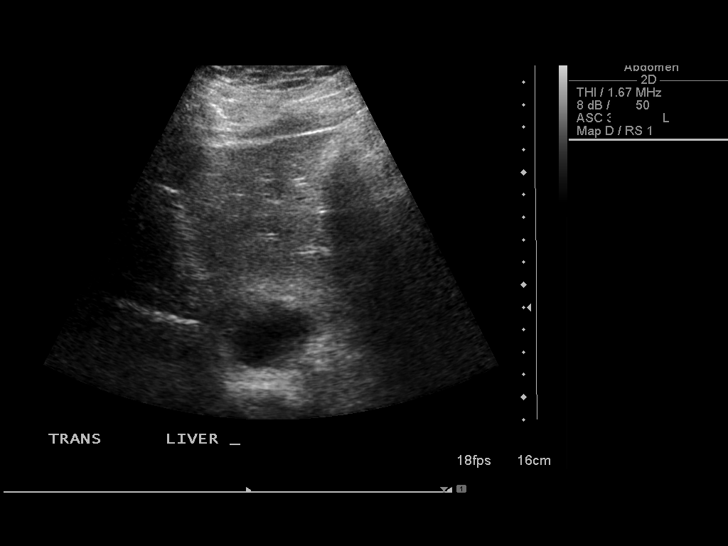
[im 20/77]
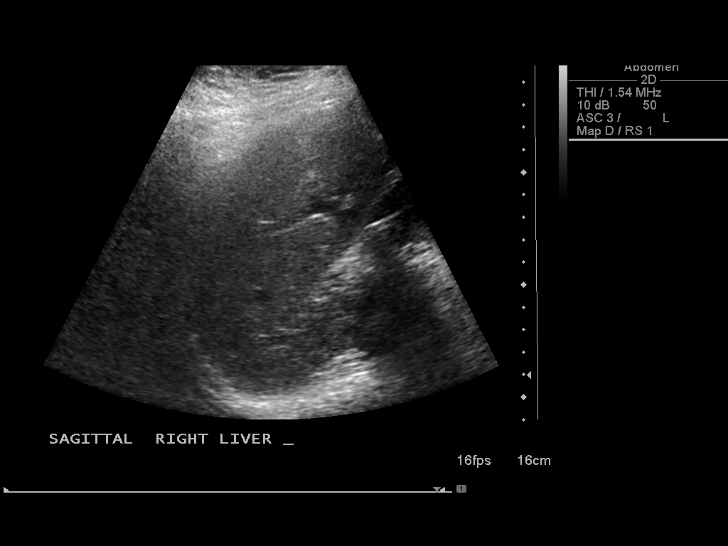
[im 26/77]
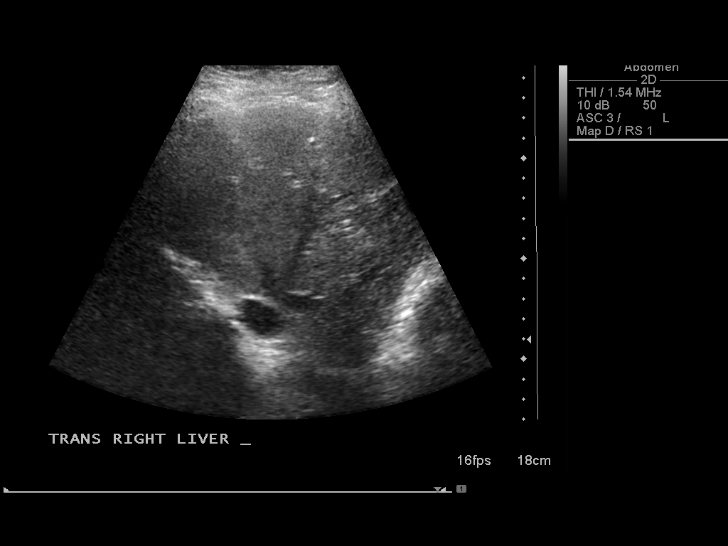
[im 29/77]
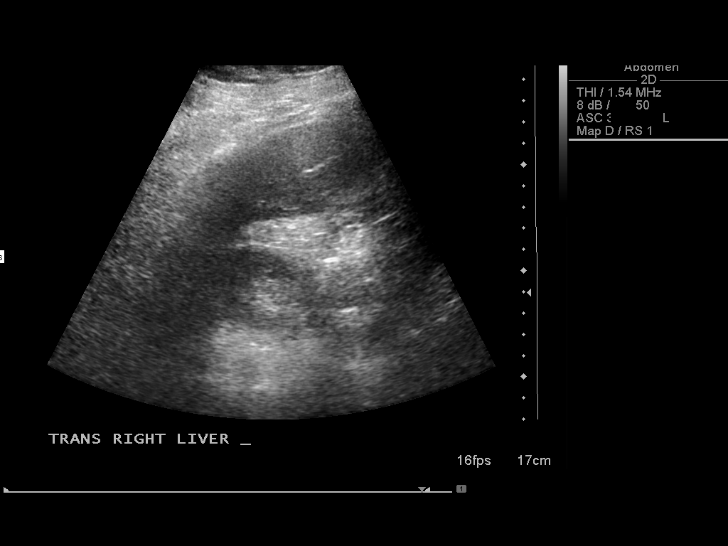
[im 35/77]
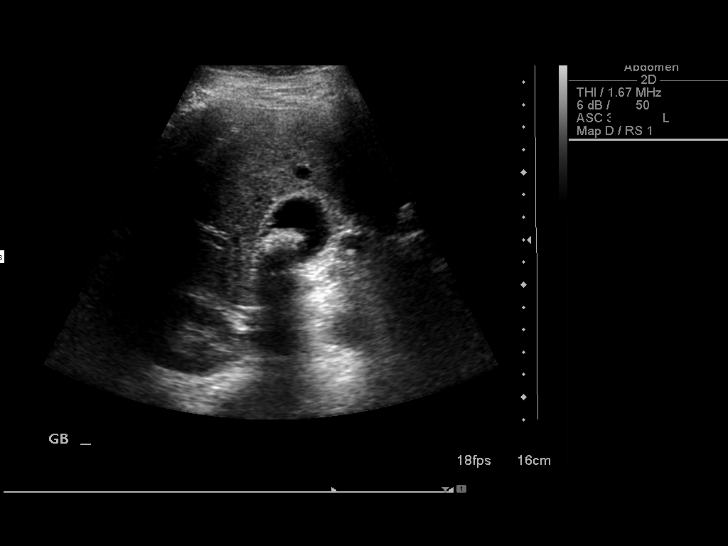
[im 42/77]
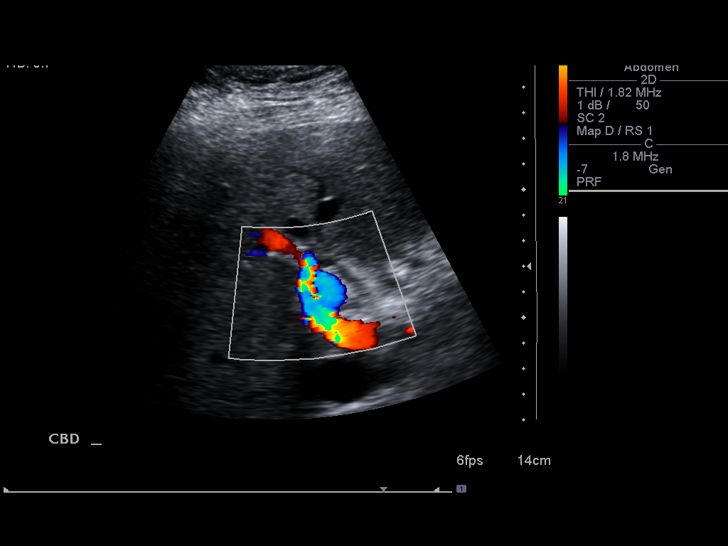
[im 48/77]
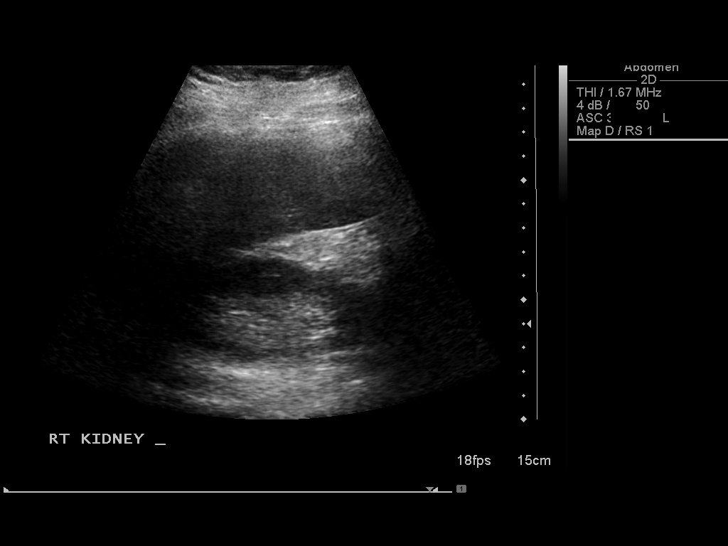
[im 51/77]
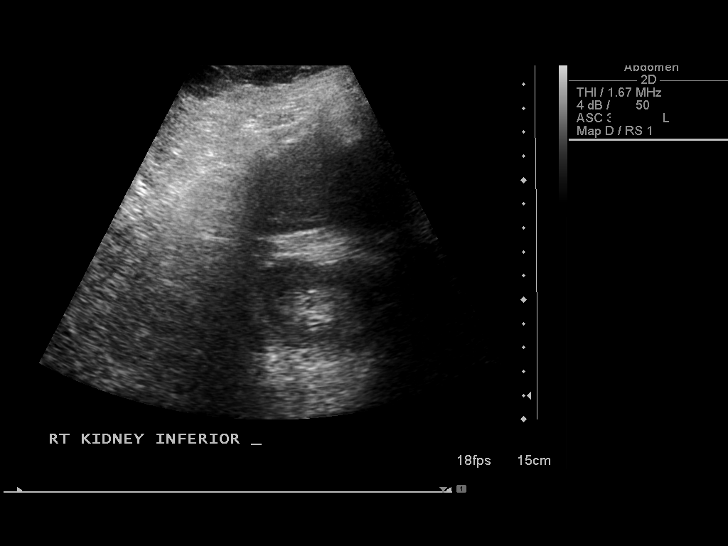
[im 58/77]
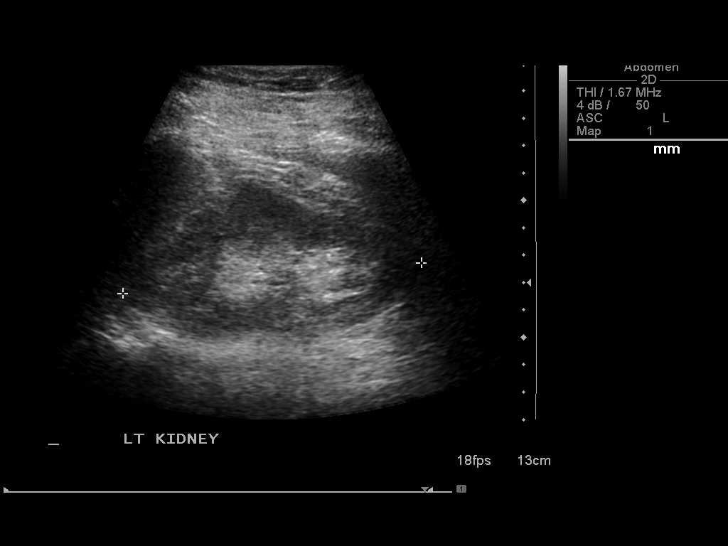
[im 64/77]
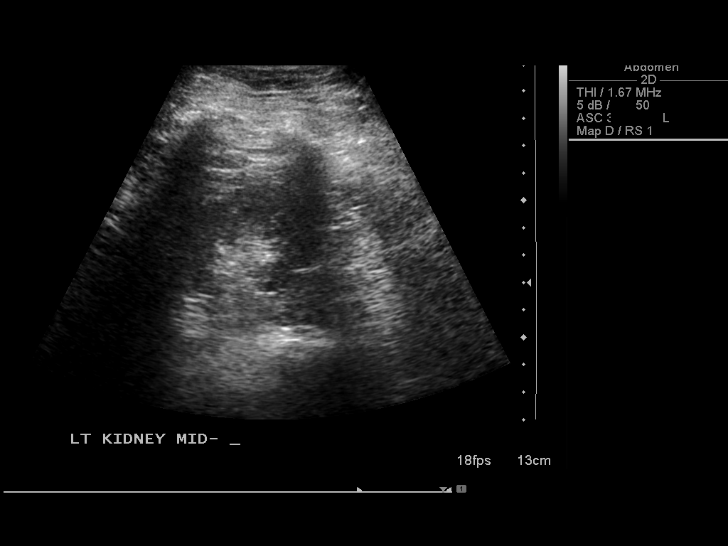
[im 70/77]
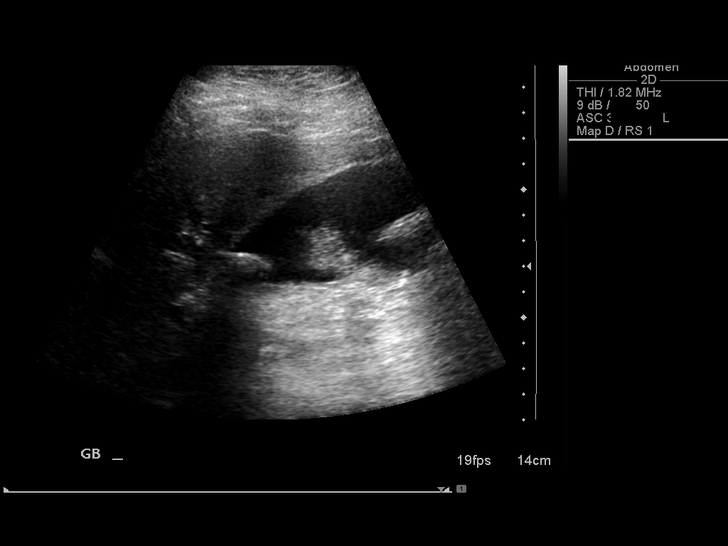
[im 77/77]
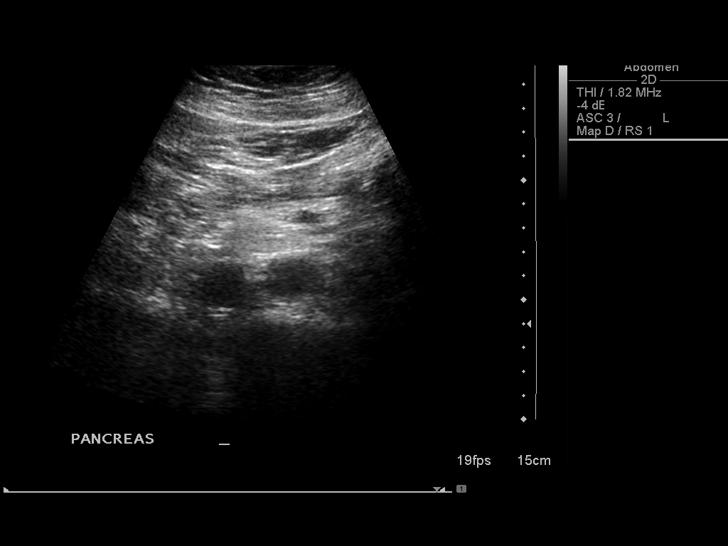

[14 of 25 positions shown; findings below may reference images not displayed]

FINDINGS: Gallbladder:  Numerous gallstones.  No wall thickening or
pericholecystic fluid. Sonographic Murphy's sign was not elicited.

Common bile duct: Normal, 4 mm.

Liver: Normal in echogenicity, without focal lesion.

IVC: Negative

Pancreas:  Poorly visualized due to overlying bowel gas.

Spleen:  Normal in size and echogenicity.

Right Kidney:  10.4 cm. No hydronephrosis.

Left Kidney:  11.0 cm. No hydronephrosis.

Abdominal aorta:  Nonaneurysmal without ascites.
IMPRESSION: Cholelithiasis without cholecystitis or biliary ductal dilatation.

Portions of anatomy obscured by overlying bowel gas.

## 2012-10-22 ENCOUNTER — Encounter (HOSPITAL_COMMUNITY): Payer: Self-pay | Admitting: *Deleted

## 2012-10-22 ENCOUNTER — Inpatient Hospital Stay (HOSPITAL_COMMUNITY)
Admission: AD | Admit: 2012-10-22 | Discharge: 2012-10-22 | Disposition: A | Discharge status: Home / Self Care | Payer: Medicaid Other | Source: Ambulatory Visit | Attending: Obstetrics and Gynecology | Admitting: Obstetrics and Gynecology

## 2012-10-22 ENCOUNTER — Inpatient Hospital Stay (HOSPITAL_COMMUNITY): Payer: Medicaid Other

## 2012-10-22 DIAGNOSIS — O133 Gestational [pregnancy-induced] hypertension without significant proteinuria, third trimester: Secondary | ICD-10-CM

## 2012-10-22 DIAGNOSIS — Z3689 Encounter for other specified antenatal screening: Secondary | ICD-10-CM

## 2012-10-22 DIAGNOSIS — Z8632 Personal history of gestational diabetes: Secondary | ICD-10-CM

## 2012-10-22 DIAGNOSIS — D573 Sickle-cell trait: Secondary | ICD-10-CM

## 2012-10-22 DIAGNOSIS — O4693 Antepartum hemorrhage, unspecified, third trimester: Secondary | ICD-10-CM

## 2012-10-22 DIAGNOSIS — O368190 Decreased fetal movements, unspecified trimester, not applicable or unspecified: Secondary | ICD-10-CM | POA: Insufficient documentation

## 2012-10-22 DIAGNOSIS — R760 Raised antibody titer: Secondary | ICD-10-CM

## 2012-10-22 DIAGNOSIS — R109 Unspecified abdominal pain: Secondary | ICD-10-CM | POA: Insufficient documentation

## 2012-10-22 DIAGNOSIS — O360131 Maternal care for anti-D [Rh] antibodies, third trimester, fetus 1: Secondary | ICD-10-CM

## 2012-10-22 HISTORY — DX: Encounter for other specified antenatal screening: Z36.89

## 2012-10-22 NOTE — Progress Notes (Signed)
BPP results given to CNM. Discharge orders given. Pt given d/c instructions and verbalizes understanding.  Pt d/c'd without difficulty.

## 2012-10-22 NOTE — Progress Notes (Signed)
CNM at bedside. May remove from fetal monitor and toco. Pt transported to U/S via wheelchair with husband.

## 2012-10-22 NOTE — MAU Note (Signed)
Pt reports she was sent from office for NST for low fetal movement. Reports having some mild cramping.

## 2012-10-22 NOTE — ED Notes (Signed)
Pt to be evaluated in L&D

## 2012-10-22 NOTE — ED Notes (Addendum)
MAU NOTE:  (OVERFLOW TO L&D RM 165)  S: Pt sent from office for repeat BPP and extended monitoring secondary to 6/10 antenatal testing at office appt.  Pt denies LOF, VB, or regular ctxs.  No PIH or UTI s/s.  Last ate around 1430.  Hadn't checked any cbg's today.  Accompanied by her husband and 3 kids.  Pt is anticipating IOL next week, but had originally thought may be here to be induced today. .. OB History   Grav Para Term Preterm Abortions TAB SAB Ect Mult Living   6 3 3  2 1 1   3     .. Past Medical History  Diagnosis Date  . Cholelithiasis   . GERD (gastroesophageal reflux disease)   . Gestational diabetes     with 2 nd baby  . Infection     Yeast;not frequent  . GDM (gestational diabetes mellitus) 2003    2nd pregnancy  . Swelling 2001    During first pregnancy;increased swelling  . Blood type, Rh negative     Needed rhogam 04/25/12 for spotting  . Sickle cell trait   . NST (non-stress test) reactive 10/22/2012   Patient Active Problem List   Diagnosis Date Noted  . NST (non-stress test) reactive 10/22/2012  . 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 Sickle cell trait 06/02/2012  . Rh negative state in antepartum period 05/30/2012  . Vaginal bleeding in pregnancy 05/30/2012  . H/O gestational diabetes in prior pregnancy, currently pregnant 05/30/2012  . Hx Gestational hypertension 05/30/2012  . 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 OBESITY, NOS 06/27/2006  . CARPAL TUNNEL SYNDROME 06/27/2006  .Marland KitchenNo Known Allergies O:  .Marland Kitchen Filed Vitals:   10/22/12 1855 10/22/12 1916  BP: 134/84 143/93  Pulse: 103 106  Temp: 98.8 F (37.1 C) 98.7 F (37.1 C)  TempSrc: Oral Oral  Resp: 18 20  Height: 5\' 5"  (1.651 m)   Weight: 264 lb 9.6 oz (120.022 kg)   NST: 140, reactive, moderate variability, no decels; TOCO: no discernable ctxs  U/s: 8/8 BPP, Vtx, FHR=150, Nml AFI  PE:  Gen: NAD, A&O x3 Abd: soft, NT, gravid Pelvic: deferred  A:  1. [redacted]w[redacted]d      2. Reactive  NST w/ 8/8 BPP=10/10 antenatal testing after 6/10 at office earlier      3. GDM-Glyburide 2.5mg  po qd      4. Obese  P:  D/c home per c/w dr. Su Hilt; will have office call tomorrow to schedule appt for Friday w/ MD w/ NST, and to discuss IOL at 39+ weeks; FKC and labor precautions. F/u prn.    Rexene Edison, CNM

## 2012-10-26 ENCOUNTER — Encounter (HOSPITAL_COMMUNITY): Payer: Self-pay

## 2012-10-26 ENCOUNTER — Inpatient Hospital Stay (HOSPITAL_COMMUNITY)
Admission: RE | Admit: 2012-10-26 | Discharge: 2012-10-28 | DRG: 774 | Disposition: A | Discharge status: Home / Self Care | Payer: Medicaid Other | Source: Ambulatory Visit | Attending: Obstetrics and Gynecology | Admitting: Obstetrics and Gynecology

## 2012-10-26 ENCOUNTER — Inpatient Hospital Stay (HOSPITAL_COMMUNITY): Payer: Medicaid Other | Admitting: Anesthesiology

## 2012-10-26 ENCOUNTER — Encounter (HOSPITAL_COMMUNITY): Payer: Self-pay | Admitting: Anesthesiology

## 2012-10-26 VITALS — BP 120/80 | HR 78 | Temp 98.3°F | Resp 18 | Ht 65.0 in | Wt 252.7 lb

## 2012-10-26 DIAGNOSIS — O99814 Abnormal glucose complicating childbirth: Principal | ICD-10-CM | POA: Diagnosis present

## 2012-10-26 DIAGNOSIS — O09293 Supervision of pregnancy with other poor reproductive or obstetric history, third trimester: Secondary | ICD-10-CM

## 2012-10-26 DIAGNOSIS — O3660X0 Maternal care for excessive fetal growth, unspecified trimester, not applicable or unspecified: Secondary | ICD-10-CM | POA: Diagnosis present

## 2012-10-26 DIAGNOSIS — K047 Periapical abscess without sinus: Secondary | ICD-10-CM | POA: Diagnosis present

## 2012-10-26 DIAGNOSIS — O4693 Antepartum hemorrhage, unspecified, third trimester: Secondary | ICD-10-CM

## 2012-10-26 DIAGNOSIS — O360131 Maternal care for anti-D [Rh] antibodies, third trimester, fetus 1: Secondary | ICD-10-CM

## 2012-10-26 DIAGNOSIS — D573 Sickle-cell trait: Secondary | ICD-10-CM

## 2012-10-26 DIAGNOSIS — R760 Raised antibody titer: Secondary | ICD-10-CM

## 2012-10-26 DIAGNOSIS — O24419 Gestational diabetes mellitus in pregnancy, unspecified control: Secondary | ICD-10-CM | POA: Diagnosis present

## 2012-10-26 HISTORY — DX: Periapical abscess without sinus: K04.7

## 2012-10-26 LAB — GLUCOSE, CAPILLARY
Glucose-Capillary: 114 mg/dL — ABNORMAL HIGH (ref 70–99)
Glucose-Capillary: 77 mg/dL (ref 70–99)

## 2012-10-26 LAB — URINALYSIS, ROUTINE W REFLEX MICROSCOPIC
Glucose, UA: NEGATIVE mg/dL
Hgb urine dipstick: NEGATIVE
Specific Gravity, Urine: <1.005 — ABNORMAL LOW (ref 1.005–1.030)
pH: 6 (ref 5.0–8.0)

## 2012-10-26 LAB — CBC
HCT: 32.8 % — ABNORMAL LOW (ref 36.0–46.0)
Platelets: 250 10*3/uL (ref 150–400)
RDW: 16 % — ABNORMAL HIGH (ref 11.5–15.5)
WBC: 6.7 10*3/uL (ref 4.0–10.5)

## 2012-10-26 LAB — PROTEIN / CREATININE RATIO, URINE
Creatinine, Urine: 58.55 mg/dL
Total Protein, Urine: 7.8 mg/dL

## 2012-10-26 LAB — COMPREHENSIVE METABOLIC PANEL
ALT: 25 U/L (ref 0–35)
AST: 29 U/L (ref 0–37)
Calcium: 10 mg/dL (ref 8.4–10.5)
Sodium: 133 mEq/L — ABNORMAL LOW (ref 135–145)
Total Protein: 6.4 g/dL (ref 6.0–8.3)

## 2012-10-26 LAB — LACTATE DEHYDROGENASE: LDH: 204 U/L (ref 94–250)

## 2012-10-26 MED ORDER — DEXTROSE 5 % IV SOLN: 3.0000 g | Freq: Once | INTRAVENOUS | Status: DC

## 2012-10-26 MED ORDER — AMOXICILLIN 500 MG PO CAPS
500.0000 mg | ORAL_CAPSULE | Freq: Four times a day (QID) | ORAL | Status: DC
  Filled 2012-10-26 (×6): qty 1

## 2012-10-26 MED ORDER — CEFAZOLIN SODIUM-DEXTROSE 2-3 GM-% IV SOLR
2.0000 g | Freq: Once | INTRAVENOUS | Status: AC
  Administered 2012-10-26: 2 g via INTRAVENOUS
  Filled 2012-10-26: qty 50

## 2012-10-26 MED ORDER — SIMETHICONE 80 MG PO CHEW: 80.0000 mg | CHEWABLE_TABLET | ORAL | Status: DC | PRN

## 2012-10-26 MED ORDER — DIBUCAINE 1 % RE OINT: 1.0000 "application " | TOPICAL_OINTMENT | RECTAL | Status: DC | PRN

## 2012-10-26 MED ORDER — MEDROXYPROGESTERONE ACETATE 150 MG/ML IM SUSP: 150.0000 mg | INTRAMUSCULAR | Status: DC | PRN

## 2012-10-26 MED ORDER — TETANUS-DIPHTH-ACELL PERTUSSIS 5-2.5-18.5 LF-MCG/0.5 IM SUSP: 0.5000 mL | Freq: Once | INTRAMUSCULAR | Status: DC

## 2012-10-26 MED ORDER — OXYTOCIN 40 UNITS IN LACTATED RINGERS INFUSION - SIMPLE MED
1.0000 m[IU]/min | INTRAVENOUS | Status: DC
  Administered 2012-10-26: 7 m[IU]/min via INTRAVENOUS
  Administered 2012-10-26: 2 m[IU]/min via INTRAVENOUS
  Filled 2012-10-26: qty 1000

## 2012-10-26 MED ORDER — PHENYLEPHRINE 40 MCG/ML (10ML) SYRINGE FOR IV PUSH (FOR BLOOD PRESSURE SUPPORT)
80.0000 ug | PREFILLED_SYRINGE | INTRAVENOUS | Status: DC | PRN
  Filled 2012-10-26: qty 5

## 2012-10-26 MED ORDER — EPHEDRINE 5 MG/ML INJ
10.0000 mg | INTRAVENOUS | Status: DC | PRN
  Filled 2012-10-26: qty 4

## 2012-10-26 MED ORDER — MEASLES, MUMPS & RUBELLA VAC ~~LOC~~ INJ: 0.5000 mL | INJECTION | Freq: Once | SUBCUTANEOUS | Status: DC

## 2012-10-26 MED ORDER — LACTATED RINGERS IV SOLN
500.0000 mL | Freq: Once | INTRAVENOUS | Status: AC
  Administered 2012-10-26: 500 mL via INTRAVENOUS

## 2012-10-26 MED ORDER — OXYTOCIN 40 UNITS IN LACTATED RINGERS INFUSION - SIMPLE MED: 62.5000 mL/h | INTRAVENOUS | Status: DC

## 2012-10-26 MED ORDER — MAGNESIUM HYDROXIDE 400 MG/5ML PO SUSP: 30.0000 mL | ORAL | Status: DC | PRN

## 2012-10-26 MED ORDER — LACTATED RINGERS IV SOLN
INTRAVENOUS | Status: DC
  Administered 2012-10-26 (×2): via INTRAVENOUS

## 2012-10-26 MED ORDER — TERBUTALINE SULFATE 1 MG/ML IJ SOLN: 0.2500 mg | Freq: Once | INTRAMUSCULAR | Status: DC | PRN

## 2012-10-26 MED ORDER — PRENATAL MULTIVITAMIN CH
1.0000 | ORAL_TABLET | Freq: Every day | ORAL | Status: DC
Start: 2012-10-27 — End: 2012-10-28
  Administered 2012-10-27: 1 via ORAL
  Filled 2012-10-26: qty 1

## 2012-10-26 MED ORDER — ONDANSETRON HCL 4 MG/2ML IJ SOLN: 4.0000 mg | Freq: Four times a day (QID) | INTRAMUSCULAR | Status: DC | PRN

## 2012-10-26 MED ORDER — IBUPROFEN 600 MG PO TABS
600.0000 mg | ORAL_TABLET | Freq: Four times a day (QID) | ORAL | Status: DC
  Administered 2012-10-26 – 2012-10-28 (×7): 600 mg via ORAL
  Filled 2012-10-26 (×7): qty 1

## 2012-10-26 MED ORDER — SENNOSIDES-DOCUSATE SODIUM 8.6-50 MG PO TABS
2.0000 | ORAL_TABLET | Freq: Every day | ORAL | Status: DC
  Administered 2012-10-27 (×2): 2 via ORAL

## 2012-10-26 MED ORDER — DIPHENHYDRAMINE HCL 25 MG PO CAPS: 25.0000 mg | ORAL_CAPSULE | Freq: Four times a day (QID) | ORAL | Status: DC | PRN

## 2012-10-26 MED ORDER — ONDANSETRON HCL 4 MG PO TABS: 4.0000 mg | ORAL_TABLET | ORAL | Status: DC | PRN

## 2012-10-26 MED ORDER — POLYSACCHARIDE IRON COMPLEX 150 MG PO CAPS
150.0000 mg | ORAL_CAPSULE | Freq: Two times a day (BID) | ORAL | Status: DC
  Administered 2012-10-26 – 2012-10-28 (×4): 150 mg via ORAL
  Filled 2012-10-26 (×4): qty 1

## 2012-10-26 MED ORDER — METHYLERGONOVINE MALEATE 0.2 MG/ML IJ SOLN: 0.2000 mg | Freq: Four times a day (QID) | INTRAMUSCULAR | Status: DC

## 2012-10-26 MED ORDER — FENTANYL 2.5 MCG/ML BUPIVACAINE 1/10 % EPIDURAL INFUSION (WH - ANES)
14.0000 mL/h | INTRAMUSCULAR | Status: DC | PRN
  Administered 2012-10-26: 14 mL/h via EPIDURAL
  Filled 2012-10-26: qty 125

## 2012-10-26 MED ORDER — LIDOCAINE HCL (PF) 1 % IJ SOLN: 30.0000 mL | INTRAMUSCULAR | Status: DC | PRN

## 2012-10-26 MED ORDER — WITCH HAZEL-GLYCERIN EX PADS: 1.0000 "application " | MEDICATED_PAD | CUTANEOUS | Status: DC | PRN

## 2012-10-26 MED ORDER — METHYLERGONOVINE MALEATE 0.2 MG PO TABS
0.2000 mg | ORAL_TABLET | Freq: Four times a day (QID) | ORAL | Status: AC
  Administered 2012-10-26 – 2012-10-27 (×6): 0.2 mg via ORAL
  Filled 2012-10-26 (×6): qty 1

## 2012-10-26 MED ORDER — ONDANSETRON HCL 4 MG/2ML IJ SOLN: 4.0000 mg | INTRAMUSCULAR | Status: DC | PRN

## 2012-10-26 MED ORDER — CEFAZOLIN SODIUM 1-5 GM-% IV SOLN
1.0000 g | Freq: Once | INTRAVENOUS | Status: AC
  Administered 2012-10-26: 1 g via INTRAVENOUS
  Filled 2012-10-26: qty 50

## 2012-10-26 MED ORDER — CEFAZOLIN SODIUM-DEXTROSE 2-3 GM-% IV SOLR
2.0000 g | Freq: Four times a day (QID) | INTRAVENOUS | Status: DC
  Administered 2012-10-26 (×2): 2 g via INTRAVENOUS
  Filled 2012-10-26 (×4): qty 50

## 2012-10-26 MED ORDER — BENZOCAINE-MENTHOL 20-0.5 % EX AERO: 1.0000 "application " | INHALATION_SPRAY | CUTANEOUS | Status: DC | PRN

## 2012-10-26 MED ORDER — ACETAMINOPHEN 325 MG PO TABS: 650.0000 mg | ORAL_TABLET | ORAL | Status: DC | PRN

## 2012-10-26 MED ORDER — DIPHENHYDRAMINE HCL 50 MG/ML IJ SOLN: 12.5000 mg | INTRAMUSCULAR | Status: DC | PRN

## 2012-10-26 MED ORDER — OXYCODONE-ACETAMINOPHEN 5-325 MG PO TABS
1.0000 | ORAL_TABLET | ORAL | Status: DC | PRN
  Administered 2012-10-27: 2 via ORAL
  Filled 2012-10-26: qty 2

## 2012-10-26 MED ORDER — OXYCODONE-ACETAMINOPHEN 5-325 MG PO TABS: 1.0000 | ORAL_TABLET | ORAL | Status: DC | PRN

## 2012-10-26 MED ORDER — LANOLIN HYDROUS EX OINT: TOPICAL_OINTMENT | CUTANEOUS | Status: DC | PRN

## 2012-10-26 MED ORDER — IBUPROFEN 600 MG PO TABS: 600.0000 mg | ORAL_TABLET | Freq: Four times a day (QID) | ORAL | Status: DC | PRN

## 2012-10-26 MED ORDER — OXYTOCIN 40 UNITS IN LACTATED RINGERS INFUSION - SIMPLE MED
INTRAVENOUS | Status: AC
  Filled 2012-10-26: qty 1000

## 2012-10-26 MED ORDER — MISOPROSTOL 200 MCG PO TABS
ORAL_TABLET | ORAL | Status: AC
  Filled 2012-10-26: qty 5

## 2012-10-26 MED ORDER — EPHEDRINE 5 MG/ML INJ: 10.0000 mg | INTRAVENOUS | Status: DC | PRN

## 2012-10-26 MED ORDER — PHENYLEPHRINE 40 MCG/ML (10ML) SYRINGE FOR IV PUSH (FOR BLOOD PRESSURE SUPPORT): 80.0000 ug | PREFILLED_SYRINGE | INTRAVENOUS | Status: DC | PRN

## 2012-10-26 MED ORDER — CITRIC ACID-SODIUM CITRATE 334-500 MG/5ML PO SOLN: 30.0000 mL | ORAL | Status: DC | PRN

## 2012-10-26 MED ORDER — LACTATED RINGERS IV SOLN: 500.0000 mL | INTRAVENOUS | Status: DC | PRN

## 2012-10-26 MED ORDER — OXYTOCIN BOLUS FROM INFUSION: 500.0000 mL | INTRAVENOUS | Status: DC

## 2012-10-26 MED ORDER — ZOLPIDEM TARTRATE 5 MG PO TABS: 5.0000 mg | ORAL_TABLET | Freq: Every evening | ORAL | Status: DC | PRN

## 2012-10-26 MED ORDER — LIDOCAINE HCL (PF) 1 % IJ SOLN
INTRAMUSCULAR | Status: DC | PRN
  Administered 2012-10-26 (×4): 4 mL
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
## 2012-10-26 NOTE — Anesthesia Preprocedure Evaluation (Signed)
Anesthesia Evaluation  Patient identified by MRN, date of birth, ID band Patient awake    Reviewed: Allergy & Precautions, H&P , NPO status , Patient's Chart, lab work & pertinent test results, reviewed documented beta blocker date and time   History of Anesthesia Complications Negative for: history of anesthetic complications  Airway Mallampati: I TM Distance: >3 FB Neck ROM: full    Dental  (+) Teeth Intact Full braces:   Pulmonary neg pulmonary ROS,  breath sounds clear to auscultation        Cardiovascular hypertension (GHTN), Rhythm:regular Rate:Normal     Neuro/Psych Back pain, late in pregnancy negative psych ROS   GI/Hepatic Neg liver ROS, GERD- (with pregnancy, tums prn)  ,  Endo/Other  diabetes, Gestational, Oral Hypoglycemic AgentsMorbid obesity  Renal/GU negative Renal ROS  negative genitourinary   Musculoskeletal   Abdominal   Peds  Hematology  (+) Sickle cell trait and anemia ,   Anesthesia Other Findings   Reproductive/Obstetrics (+) Pregnancy                           Anesthesia Physical Anesthesia Plan  ASA: III  Anesthesia Plan: Epidural   Post-op Pain Management:    Induction:   Airway Management Planned:   Additional Equipment:   Intra-op Plan:   Post-operative Plan:   Informed Consent: I have reviewed the patients History and Physical, chart, labs and discussed the procedure including the risks, benefits and alternatives for the proposed anesthesia with the patient or authorized representative who has indicated his/her understanding and acceptance.     Plan Discussed with:   Anesthesia Plan Comments:         Anesthesia Quick Evaluation

## 2012-10-26 NOTE — Anesthesia Procedure Notes (Signed)
Epidural Patient location during procedure: OB Start time: 10/26/2012 10:20 AM  Staffing Performed by: anesthesiologist   Preanesthetic Checklist Completed: patient identified, site marked, surgical consent, pre-op evaluation, timeout performed, IV checked, risks and benefits discussed and monitors and equipment checked  Epidural Patient position: sitting Prep: site prepped and draped and DuraPrep Patient monitoring: continuous pulse ox and blood pressure Approach: midline Injection technique: LOR air  Needle:  Needle type: Tuohy  Needle gauge: 17 G Needle length: 9 cm and 9 Needle insertion depth: 8 cm Catheter type: closed end flexible Catheter size: 19 Gauge Catheter at skin depth: 13 cm Test dose: negative  Assessment Events: blood not aspirated, injection not painful, no injection resistance, negative IV test and no paresthesia  Additional Notes Discussed risk of headache, infection, bleeding, nerve injury and failed or incomplete block.  Patient voices understanding and wishes to proceed.  Epidural placed easily on first attempt.  No paresthesia.  Patient tolerated procedure well with no apparent complications.  Jasmine December, MD Reason for block:procedure for pain

## 2012-10-26 NOTE — Progress Notes (Signed)
Subjective: Pt comfortable s/p epidural about one hr ago.  Pitocin on 6mu.  Pt alone in room; husband and 3 other children went for a "walk."  Objective: BP 98/61  Pulse 91  Temp(Src) 98 F (36.7 C) (Oral)  Resp 18  Ht 5\' 5"  (1.651 m)  Wt 267 lb (121.11 kg)  BMI 44.43 kg/m2  LMP 01/27/2012     S/P EPIDURAL Filed Vitals:   10/26/12 1101 10/26/12 1107 10/26/12 1108 10/26/12 1130  BP: 118/66 98/54 98/61  113/76  Pulse: 97 85 91 102  Temp:   98 F (36.7 C)   TempSrc:      Resp:      Height:      Weight:       FHT:  FHR: 135 bpm, variability: moderate,  accelerations:  Present,  decelerations:  Absent UC:   irregular, every 4-6 minutes SVE:  6/90/-1, AROm mod amt MSF Labs: Lab Results  Component Value Date   WBC 6.7 10/26/2012   HGB 10.4* 10/26/2012   HCT 32.8* 10/26/2012   MCV 72.4* 10/26/2012   PLT 250 10/26/2012    Assessment / Plan: 1. 39weeks 2. IOL for GDM--Glyburide 3. obese 4. borderline BP's recently 5. acute abscess of lt lower molar  Labor: Progressing normally Preeclampsia:  intake and ouput balanced Fetal Wellbeing:  Category I Pain Control:  Epidural I/D:  n/a Anticipated MOD:  NSVD 1. Sent PIH labs and u/a w/ protein/creatinine ratio 2. IUPC prn 3. CTO for labor progression 4. C/w MD prn  Carmen Kelly H 10/26/2012, 11:35 AM

## 2012-10-26 NOTE — Op Note (Signed)
OPERATIVE NOTE  Carmen Kelly  DOB:    07-30-1980  MRN:    [id number]  CSN:    [id number]  Date of Surgery:  10/26/2012  Preoperative Diagnosis:  S/P vaginal delivery at [redacted]w[redacted]d Gestation Retained placenta  Postoperative Diagnosis:  S/P vaginal delivery at [redacted]w[redacted]d Gestation Retained placenta  Procedure:  Manual Removal of the Placenta Uterine Curettage  Surgeon:  Leonard Schwartz, M.D.  Assistant:  None  Anesthetic:  Epidural  Disposition:  The patient is a 32 y.o. year old female who presents at [redacted]w[redacted]d gestation with a retained placenta after a vaginal delivery. She received two grams of Ancef two and a half hours prior to delivery. She understands the indications for her surgical procedure as well as the alternative treatment options. She accepts the risk of, but not limited to, anesthetic complications, bleeding, infections, and possible damage to the surrounding organs.  Findings:  The placenta was removed in fragments.  No adnexal masses were appreciated.  Procedure:  The patient was evaluated on L and D. Examination under anesthesia was performed. The patient was given one gram of Ancef ( She received 2 grams of Ancef previously). The cavity was manually explored. The placenta was removed in fragments. The cervix was held with a ring forceps. The uterine cavity was evacuated using a large curet. The cavity was felt to be clean at the end of our procedure. The estimated blood loss was 250 cc. The uterus was reexamined and was noted to be firm. Hemostasis was adequate. The patient tolerated her procedure well. The placenta was sent to pathology.  Followup instructions:  The patient will receive one additional dose of Ancef in six hours. She will be given methergine 2.5 mg (she has had several BP's that were slightly elevated, but her BP is normal now and she does not have a history of hypertension).  Leonard Schwartz, M.D.

## 2012-10-27 LAB — CBC
HCT: 34.4 % — ABNORMAL LOW (ref 36.0–46.0)
MCV: 72.3 fL — ABNORMAL LOW (ref 78.0–100.0)
Platelets: 214 10*3/uL (ref 150–400)
RBC: 4.76 MIL/uL (ref 3.87–5.11)
RDW: 15.9 % — ABNORMAL HIGH (ref 11.5–15.5)
WBC: 9.2 10*3/uL (ref 4.0–10.5)

## 2012-10-27 LAB — GLUCOSE, CAPILLARY: Glucose-Capillary: 106 mg/dL — ABNORMAL HIGH (ref 70–99)

## 2012-10-27 MED ORDER — RHO D IMMUNE GLOBULIN 1500 UNIT/2ML IJ SOLN
300.0000 ug | Freq: Once | INTRAMUSCULAR | Status: AC
  Administered 2012-10-27: 300 ug via INTRAMUSCULAR
  Filled 2012-10-27: qty 2

## 2012-10-27 NOTE — Progress Notes (Signed)
PROGRESS NOTE  I have reviewed the patient's vital signs, labs, and notes. I have examined the patient. I agree with the previous note from the Certified Nurse Midwife.   The patient will continue her Amoxicillin. She may want to go home today.  Leonard Schwartz, M.D. 10/27/2012

## 2012-10-27 NOTE — Progress Notes (Addendum)
Post Partum Day 1: S/P SVD without repair  Subjective: Patient up ad lib, denies syncope or dizziness. Feeding:  Breastfeeding Contraceptive plan:   Unknown at this time  Objective: Blood pressure 136/87, pulse 92, temperature 98.4 F (36.9 C), temperature source Oral, resp. rate 18, height 5\' 5"  (1.651 m), weight 254 lb 8 oz (115.44 kg), last menstrual period 01/27/2012, SpO2 99.00%, unknown if currently breastfeeding.  Physical Exam:  General: alert, cooperative and no distress Lochia: appropriate Uterine Fundus: firm Incision: n/a DVT Evaluation: No evidence of DVT seen on physical exam. Negative Homan's sign.   Recent Labs  10/26/12 0735 10/27/12 0600  HGB 10.4* 11.3*  HCT 32.8* 34.4*   CBG this am - 106  Assessment/Plan: S/P Vaginal delivery day 1 Continue current care Plan for discharge tomorrow   LOS: 1 day   Carmen Kelly 10/27/2012, 8:08 AM

## 2012-10-27 NOTE — Progress Notes (Signed)
UR chart review completed.  

## 2012-10-27 NOTE — Anesthesia Postprocedure Evaluation (Signed)
  Anesthesia Post-op Note  Patient: Carmen Kelly  Procedure(s) Performed: * No procedures listed *  Patient Location: PACU and Mother/Baby  Anesthesia Type:Epidural  Level of Consciousness: awake  Airway and Oxygen Therapy: Patient Spontanous Breathing  Post-op Pain: none  Post-op Assessment: Patient's Cardiovascular Status Stable, Respiratory Function Stable, Patent Airway, No signs of Nausea or vomiting, Adequate PO intake, Pain level controlled, No headache, No backache, No residual numbness and No residual motor weakness  Post-op Vital Signs: Reviewed and stable  Complications: No apparent anesthesia complications

## 2012-10-28 LAB — RH IG WORKUP (INCLUDES ABO/RH)
ABO/RH(D): A NEG
Antibody Screen: NEGATIVE
Fetal Screen: NEGATIVE
Unit division: 0

## 2012-10-28 LAB — GLUCOSE, CAPILLARY: Glucose-Capillary: 94 mg/dL (ref 70–99)

## 2012-10-28 MED ORDER — IBUPROFEN 600 MG PO TABS: 600.0000 mg | ORAL_TABLET | Freq: Four times a day (QID) | ORAL | Status: AC

## 2012-10-28 MED ORDER — AMOXICILLIN 500 MG PO CAPS: 500.0000 mg | ORAL_CAPSULE | Freq: Four times a day (QID) | ORAL | Status: AC

## 2012-10-28 NOTE — Discharge Summary (Signed)
  Vaginal Delivery Discharge Summary  Carmen Kelly  DOB:    Jul 04, 1980 MRN:    [id number] CSN:    [id number]  Date of admission:                  10/26/12  Date of discharge:                   10/28/12  Procedures this admission: SVD without laceration  Date of Delivery: 10/26/12 by French Ana  Newborn Data:  Live born female  Birth Weight: 9 lb 0.8 oz (4105 g) APGAR: 9, 9  Home with mother. Name: "Addison" Circumcision Plan: n/a  History of Present Illness:  Carmen Kelly is a 32 y.o. female, [phone], who presents at [redacted]w[redacted]d weeks gestation. The patient has been followed at the [facility] and [facility] division of [facility]. She was admitted induction of labor. Her pregnancy has been complicated by:   Patient Active Problem List   Diagnosis Date Noted  . Gestational diabetes 10/26/2012  . Vaginal delivery 10/26/2012  . Retained placenta w/o hemorrhage, delivered, current hospitalization 10/26/2012  . Meconium in amniotic fluid affecting management of mother, delivered 10/26/2012  . Fetal macrosomia, delivered, current hospitalization 10/26/2012  . Abscessed tooth 10/26/2012  . Sickle cell trait 06/02/2012  . H/O gestational diabetes in prior pregnancy, currently pregnant 05/30/2012  . Hx Gestational hypertension 05/30/2012  . 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 OBESITY, NOS 06/27/2006  . CARPAL TUNNEL SYNDROME 06/27/2006    Hospital course:  The patient was admitted for IOL for GDM.   Her labor was not complicated. She proceeded to have a vaginal delivery of a healthy infant. Her delivery was complicated by retained placenta requiring manual removal and abx treatment . Her postpartum course was not complicated.  She was discharged to home on postpartum day 2 doing well.  Feeding:  breast  Contraception:  Considering Mirena or vasectomy  Discharge hemoglobin:  Hemoglobin  Date Value Range Status   10/27/2012 11.3* 12.0 - 15.0 g/dL Final     HCT  Date Value Range Status  10/27/2012 34.4* 36.0 - 46.0 % Final   CBG (last 3)   Recent Labs  10/26/12 1114 10/27/12 0548 10/28/12 0614  GLUCAP 77 106* 94     Discharge Physical Exam:   General: alert, cooperative and no distress Lochia: appropriate Uterine Fundus: firm Incision: n/a DVT Evaluation: No evidence of DVT seen on physical exam. Negative Homan's sign.  Intrapartum Procedures: spontaneous vaginal delivery Postpartum Procedures: manual removal of placenta Complications-Operative and Postpartum: none  Discharge Diagnoses: Term Pregnancy-delivered, GDM, ABX for manual removal of placenta and for dental abscess present on admission.   Discharge Information:  Activity:           pelvic rest Diet:                routine Medications: PNV, Ibuprofen and Amoxicillin 500 mg PO Q6 hours x 7 days Condition:      stable Instructions:  refer to practice specific booklet Discharge to: home  Follow-up Information   Follow up with [facility]. Schedule an appointment as soon as possible for a visit in 5 weeks. (Make appointment with Dentist within 2 weeks for re-evaluation.  Call with any questions or concerns.)    Contact information:   [address] [place] [address] [phone]       Haroldine Laws 10/28/2012

## 2013-03-05 ENCOUNTER — Other Ambulatory Visit: Payer: Self-pay

## 2013-08-14 IMAGING — US US FETAL BPP W/O NONSTRESS
1 series · 5 of 5 positions shown · non-contrast
Comparison: none

CLINICAL DATA: Repeat BPP.

 BIOPHYSICAL PROFILE
 Number of Fetuses: 1
Heart Rate: 150 bpm
Presentation:  Cephalic
Amniotic Fluid (Subjective): Normal
Vertical pocket:  6.4cm
BPP:
Movement:  2   Time:  24
Breathing: 2
Tone: 2
Amniotic Fluid: 2
 Total Score: [DATE].

[Series 1: us fetal bpp w/o nonstress · non-contrast · 5 acquisitions, 5 frames shown]
[im 1/5]
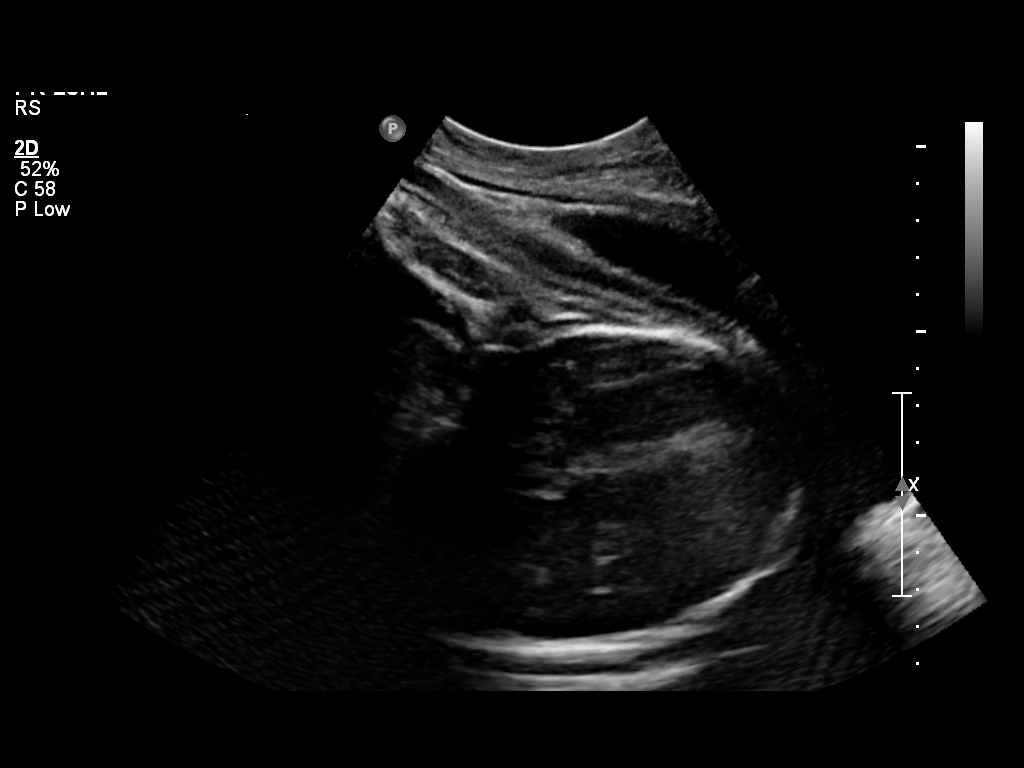
[im 2/5]
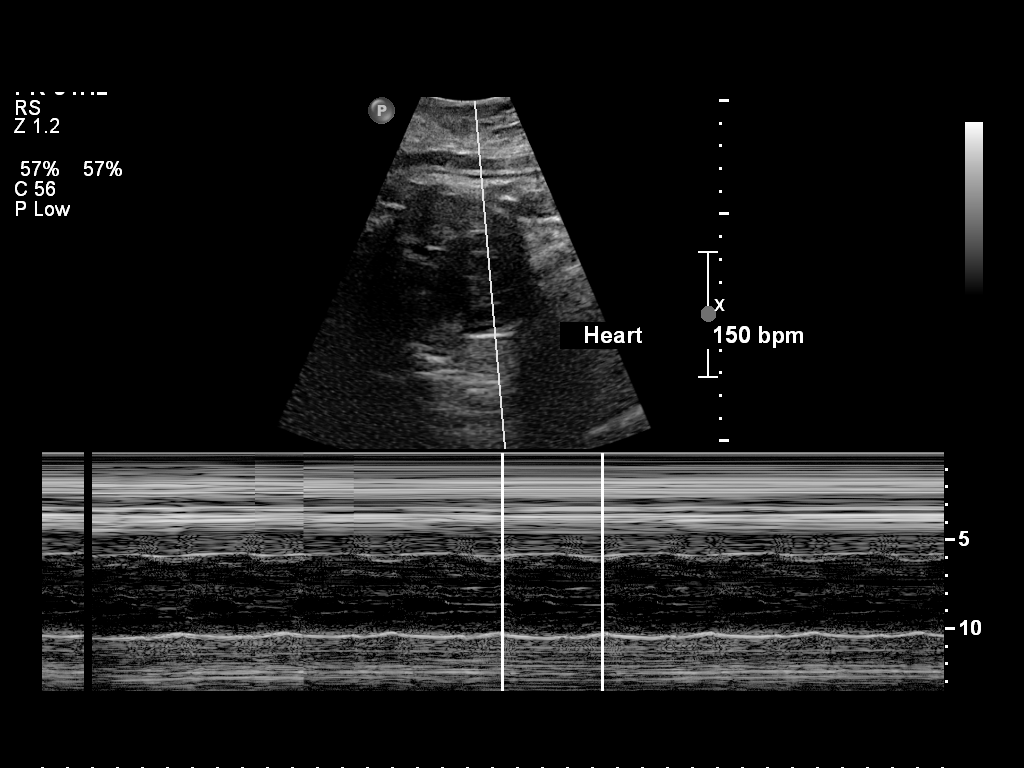
[im 3/5]
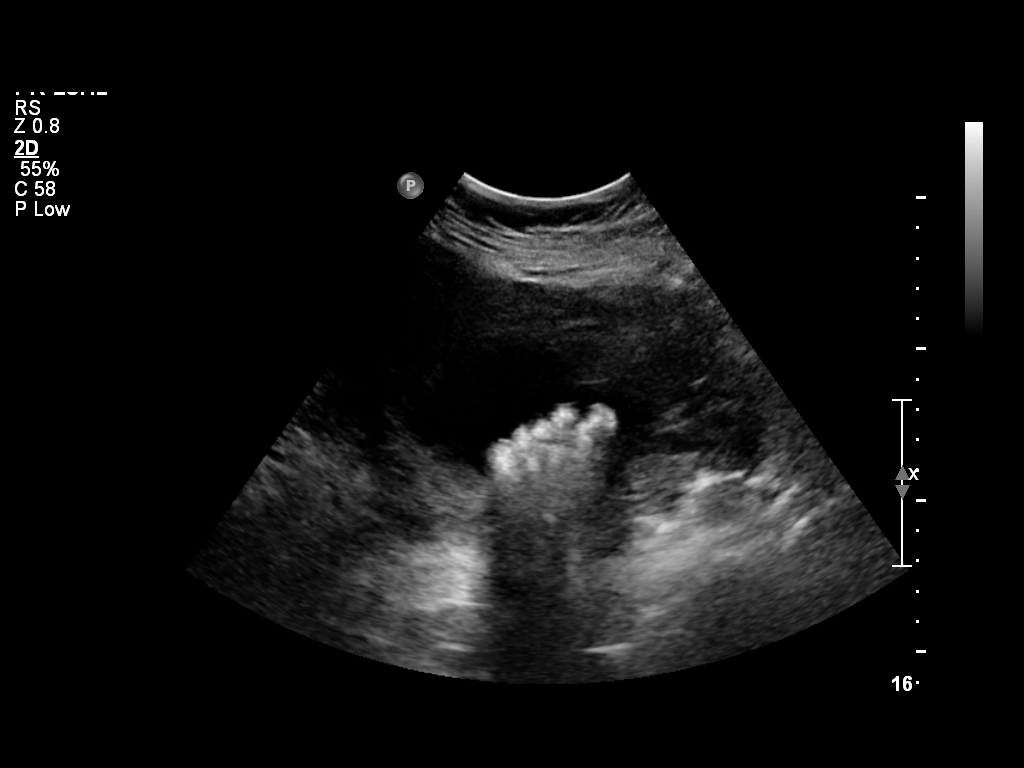
[im 4/5]
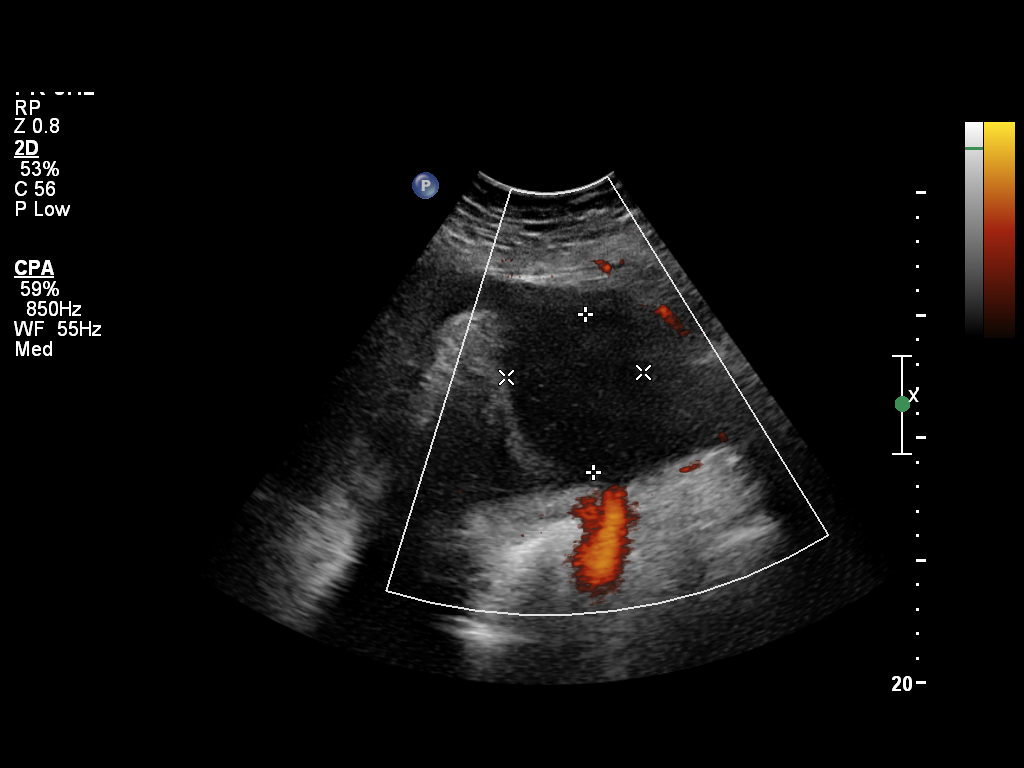
[im 5/5]
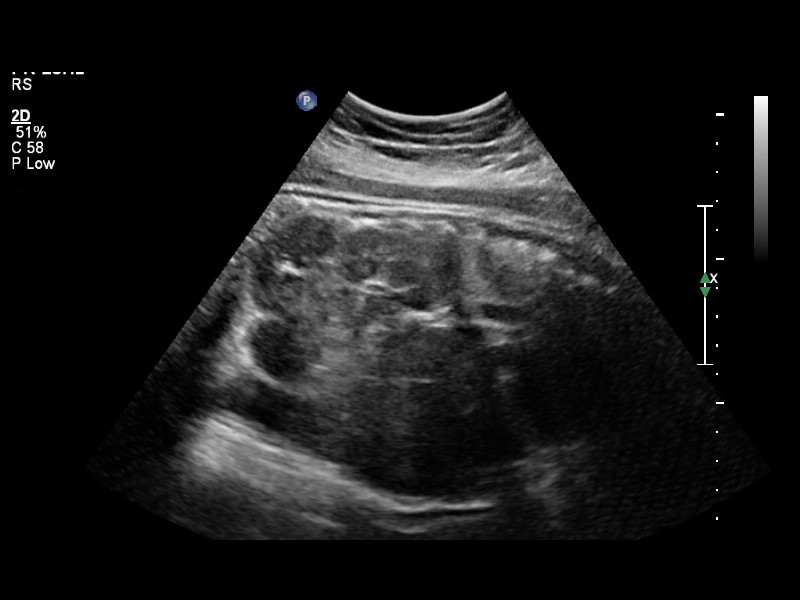

[5 of 5 positions shown; findings below may reference images not displayed]

IMPRESSION: BPP [DATE].  n

## 2014-03-01 ENCOUNTER — Encounter (HOSPITAL_COMMUNITY): Payer: Self-pay

## 2015-01-08 ENCOUNTER — Encounter (HOSPITAL_COMMUNITY): Payer: Self-pay | Admitting: Emergency Medicine

## 2015-01-08 ENCOUNTER — Emergency Department (HOSPITAL_COMMUNITY)
Admission: EM | Admit: 2015-01-08 | Discharge: 2015-01-08 | Disposition: A | Discharge status: Home / Self Care | Payer: Medicaid Other | Attending: Emergency Medicine | Admitting: Emergency Medicine

## 2015-01-08 DIAGNOSIS — Z8719 Personal history of other diseases of the digestive system: Secondary | ICD-10-CM | POA: Insufficient documentation

## 2015-01-08 DIAGNOSIS — Y9289 Other specified places as the place of occurrence of the external cause: Secondary | ICD-10-CM | POA: Insufficient documentation

## 2015-01-08 DIAGNOSIS — Z862 Personal history of diseases of the blood and blood-forming organs and certain disorders involving the immune mechanism: Secondary | ICD-10-CM | POA: Insufficient documentation

## 2015-01-08 DIAGNOSIS — Z79899 Other long term (current) drug therapy: Secondary | ICD-10-CM | POA: Insufficient documentation

## 2015-01-08 DIAGNOSIS — Y9389 Activity, other specified: Secondary | ICD-10-CM | POA: Insufficient documentation

## 2015-01-08 DIAGNOSIS — Y998 Other external cause status: Secondary | ICD-10-CM | POA: Insufficient documentation

## 2015-01-08 DIAGNOSIS — S0502XA Injury of conjunctiva and corneal abrasion without foreign body, left eye, initial encounter: Secondary | ICD-10-CM | POA: Insufficient documentation

## 2015-01-08 DIAGNOSIS — Z23 Encounter for immunization: Secondary | ICD-10-CM | POA: Insufficient documentation

## 2015-01-08 DIAGNOSIS — X58XXXA Exposure to other specified factors, initial encounter: Secondary | ICD-10-CM | POA: Insufficient documentation

## 2015-01-08 DIAGNOSIS — Z8619 Personal history of other infectious and parasitic diseases: Secondary | ICD-10-CM | POA: Insufficient documentation

## 2015-01-08 MED ORDER — TETANUS-DIPHTH-ACELL PERTUSSIS 5-2.5-18.5 LF-MCG/0.5 IM SUSP
0.5000 mL | Freq: Once | INTRAMUSCULAR | Status: AC
  Administered 2015-01-08: 0.5 mL via INTRAMUSCULAR
  Filled 2015-01-08: qty 0.5

## 2015-01-08 MED ORDER — TETRACAINE HCL 0.5 % OP SOLN
2.0000 [drp] | Freq: Once | OPHTHALMIC | Status: AC
  Administered 2015-01-08: 2 [drp] via OPHTHALMIC
  Filled 2015-01-08: qty 2

## 2015-01-08 MED ORDER — ERYTHROMYCIN 5 MG/GM OP OINT: TOPICAL_OINTMENT | OPHTHALMIC | Status: AC

## 2015-01-08 MED ORDER — FLUORESCEIN SODIUM 1 MG OP STRP
1.0000 | ORAL_STRIP | Freq: Once | OPHTHALMIC | Status: AC
  Administered 2015-01-08: 1 via OPHTHALMIC
  Filled 2015-01-08: qty 1

## 2015-01-08 NOTE — Progress Notes (Addendum)
Pt stated,"my two year old scratched my eye. Pt c/o left eye pain. Left eye is very edematous and red in the sclera portion of the eye. _the eye is tearing as well. Visual acuity - Left eye - 20/20 right eye 20/25.PA with the pt.

## 2015-01-08 NOTE — Discharge Instructions (Signed)
Corneal Abrasion °The cornea is the clear covering at the front and center of the eye. When looking at the colored portion of the eye (iris), you are looking through the cornea. This very thin tissue is made up of many layers. The surface layer is a single layer of cells (corneal epithelium) and is one of the most sensitive tissues in the body. If a scratch or injury causes the corneal epithelium to come off, it is called a corneal abrasion. If the injury extends to the tissues below the epithelium, the condition is called a corneal ulcer. °CAUSES  °· Scratches. °· Trauma. °· Foreign body in the eye. °Some people have recurrences of abrasions in the area of the original injury even after it has healed (recurrent erosion syndrome). Recurrent erosion syndrome generally improves and goes away with time. °SYMPTOMS  °· Eye pain. °· Difficulty or inability to keep the injured eye open. °· The eye becomes very sensitive to light. °· Recurrent erosions tend to happen suddenly, first thing in the morning, usually after waking up and opening the eye. °DIAGNOSIS  °Your health care provider can diagnose a corneal abrasion during an eye exam. Dye is usually placed in the eye using a drop or a small paper strip moistened by your tears. When the eye is examined with a special light, the abrasion shows up clearly because of the dye. °TREATMENT  °· Small abrasions may be treated with antibiotic drops or ointment alone. °· A pressure patch may be put over the eye. If this is done, follow your doctor's instructions for when to remove the patch. Do not drive or use machines while the eye patch is on. Judging distances is hard to do with a patch on. °If the abrasion becomes infected and spreads to the deeper tissues of the cornea, a corneal ulcer can result. This is serious because it can cause corneal scarring. Corneal scars interfere with light passing through the cornea and cause a loss of vision in the involved eye. °HOME CARE  INSTRUCTIONS °· Use medicine or ointment as directed. Only take over-the-counter or prescription medicines for pain, discomfort, or fever as directed by your health care provider. °· Do not drive or operate machinery if your eye is patched. Your ability to judge distances is impaired. °· If your health care provider has given you a follow-up appointment, it is very important to keep that appointment. Not keeping the appointment could result in a severe eye infection or permanent loss of vision. If there is any problem keeping the appointment, let your health care provider know. °SEEK MEDICAL CARE IF:  °· You have pain, light sensitivity, and a scratchy feeling in one eye or both eyes. °· Your pressure patch keeps loosening up, and you can blink your eye under the patch after treatment. °· Any kind of discharge develops from the eye after treatment or if the lids stick together in the morning. °· You have the same symptoms in the morning as you did with the original abrasion days, weeks, or months after the abrasion healed. °MAKE SURE YOU:  °· Understand these instructions. °· Will watch your condition. °· Will get help right away if you are not doing well or get worse. °Document Released: 04/13/2000 Document Revised: 04/21/2013 Document Reviewed: 12/22/2012 °Exit[employer]® Patient Information ©2015 [employer]. This information is not intended to replace advice given to you by your health care provider. Make sure you discuss any questions you have with your health care provider. ° °

## 2015-01-08 NOTE — ED Notes (Signed)
Pt states daughter scratched eye, pt had redness to left eye and has a scratch to lower part of iris. Pt states vision is blurry and it hurts to open eye.

## 2015-01-08 NOTE — ED Provider Notes (Signed)
CSN: [id number]     Arrival date & time 01/08/15  1729 History  This chart was scribed for Fayrene Helper, PA-C, working with Laurence Spates, MD by Chestine Spore, ED Scribe. The patient was seen in room WTR9/WTR9 at 7:00 PM.    Chief Complaint  Patient presents with  . Eye Injury    left      The history is provided by the patient. No language interpreter was used.    HPI Comments: Carmen Kelly is a 34 y.o. female who presents to the Emergency Department complaining of eye injury onset PTA. She notes that her 3 year old daughter scratched her in her left eye. Pt is unsure of when her last tetanus shot was. She states that she is having associated symptoms of left eye redness, blurred vision, and left eye pain. She states that she has not tried any medications for the relief for her symptoms. She denies eye discharge and any other symptoms.   Past Medical History  Diagnosis Date  . Cholelithiasis   . GERD (gastroesophageal reflux disease)   . Gestational diabetes     with 2 nd baby  . Infection     Yeast;not frequent  . GDM (gestational diabetes mellitus) 2003    2nd pregnancy  . Swelling 2001    During first pregnancy;increased swelling  . Blood type, Rh negative     Needed rhogam 04/25/12 for spotting  . Sickle cell trait   . NST (non-stress test) reactive 10/22/2012  . Vaginal delivery 10/26/2012  . Retained placenta w/o hemorrhage, delivered, current hospitalization 10/26/2012    Bimanual removal of body of placenta by Dr. Stefano Gaul, but not intact; followed by Curettage in L&D using epidural  . Abscessed tooth 10/26/2012    Lt lower molar--dentist Rx'd Amoxicillin--given Ancef 2gm IV q6hrs intrapartum; will restart po tomorrow at 1000   Past Surgical History  Procedure Laterality Date  . Wisdom tooth extraction    . Cholecystectomy  11/19/2011    Procedure: LAPAROSCOPIC CHOLECYSTECTOMY;  Surgeon: Wilmon Arms. Corliss Skains, MD;  Location: [facility] ORS;  Service: General;  Laterality:  N/A;  . Cholecystectomy     Family History  Problem Relation Age of Onset  . Adopted: Yes  . Cancer Mother     Cervical  . Cancer Sister     Colon  . Cancer Brother     Colon  . Cancer Maternal Grandmother     Breast  . Cancer Brother     Colon  . Sickle cell trait Son   . Seizures Other     Neice  . Anemia Daughter   . Migraines Son   . Asthma Son   . Irritable bowel syndrome Sister   . ADD / ADHD Son    Social History  Substance Use Topics  . Smoking status: Never Smoker   . Smokeless tobacco: Never Used  . Alcohol Use: No   OB History    Gravida Para Term Preterm AB TAB SAB Ectopic Multiple Living   Review of Systems  Eyes: Positive for pain, redness and visual disturbance. Negative for discharge.  Skin: Negative for color change and rash.      Allergies  Review of patient's allergies indicates no known allergies.  Home Medications   Prior to Admission medications   Medication Sig Start Date End Date Taking? Authorizing Provider  calcium carbonate (TUMS - DOSED IN MG ELEMENTAL  CALCIUM) 500 MG chewable tablet Chew 3 tablets by mouth 2 (two) times daily as needed for heartburn.    Historical Provider, MD  ibuprofen (ADVIL,MOTRIN) 600 MG tablet Take 1 tablet (600 mg total) by mouth every 6 (six) hours. 10/28/12   Haroldine Laws, CNM  Prenatal Vit-Fe Fumarate-FA (PRENATAL MULTIVITAMIN) TABS Take 1 tablet by mouth daily.    Historical Provider, MD   BP 129/86 mmHg  Pulse 97  Temp(Src) 98.3 F (36.8 C) (Oral)  Resp 18  SpO2 99%  LMP 12/24/2014 (Approximate) Physical Exam  Constitutional: She is oriented to person, place, and time. She appears well-developed and well-nourished. No distress.  HENT:  Head: Normocephalic and atraumatic.  Eyes: EOM and lids are normal. Pupils are equal, round, and reactive to light. Lids are everted and swept, no foreign bodies found. Right conjunctiva is not injected. Left conjunctiva is injected.  Slit  lamp exam:      The left eye shows corneal abrasion. The left eye shows no corneal ulcer.  Left eye: conjunctiva injected. PERRL. EOM intact. 3 mm corneal abrasion noted to the pupil laterally. No dendritic changes or corneal ulceration. Negative seidel sign.  Neck: Neck supple. No tracheal deviation present.  Cardiovascular: Normal rate.   Pulmonary/Chest: Effort normal. No respiratory distress.  Musculoskeletal: Normal range of motion.  Neurological: She is alert and oriented to person, place, and time.  Skin: Skin is warm and dry.  Psychiatric: She has a normal mood and affect. Her behavior is normal.  Nursing note and vitals reviewed.   ED Course  Procedures (including critical care time) DIAGNOSTIC STUDIES: Oxygen Saturation is 99% on RA, nl by my interpretation.    COORDINATION OF CARE: 7:07 PM- This is a corneal abrasion of the left eye. Will treat with azithromycin ointment and update tetanus vaccination at this time.  Discussed treatment plan with pt at bedside which includes azithromycin ointment and tetanus updated and pt agreed to plan.    MDM   Final diagnoses:  Corneal abrasion, left, initial encounter    BP 130/76 mmHg  Pulse 88  Temp(Src) 98 F (36.7 C) (Oral)  Resp 20  SpO2 100%  LMP 12/24/2014 (Approximate)   I personally performed the services described in this documentation, which was scribed in my presence. The recorded information has been reviewed and is accurate.     Fayrene Helper, PA-C 01/08/15 1919  Laurence Spates, MD 01/08/15 [phone]

## 2017-05-31 DEATH — deceased
